# Patient Record
Sex: Female | Born: 1945 | Race: Black or African American | Hispanic: No | Smoking: Former smoker
Health system: Southern US, Community
[De-identification: ages and names within clinical notes are randomized; demographics above are authoritative.]

## PROBLEM LIST (undated history)

## (undated) DIAGNOSIS — Z8719 Personal history of other diseases of the digestive system: Secondary | ICD-10-CM

## (undated) DIAGNOSIS — I82409 Acute embolism and thrombosis of unspecified deep veins of unspecified lower extremity: Secondary | ICD-10-CM

## (undated) DIAGNOSIS — E109 Type 1 diabetes mellitus without complications: Secondary | ICD-10-CM

## (undated) DIAGNOSIS — K275 Chronic or unspecified peptic ulcer, site unspecified, with perforation: Secondary | ICD-10-CM

## (undated) DIAGNOSIS — C259 Malignant neoplasm of pancreas, unspecified: Secondary | ICD-10-CM

## (undated) DIAGNOSIS — E7800 Pure hypercholesterolemia, unspecified: Secondary | ICD-10-CM

## (undated) DIAGNOSIS — Z8711 Personal history of peptic ulcer disease: Secondary | ICD-10-CM

## (undated) DIAGNOSIS — D649 Anemia, unspecified: Secondary | ICD-10-CM

## (undated) DIAGNOSIS — R011 Cardiac murmur, unspecified: Secondary | ICD-10-CM

## (undated) DIAGNOSIS — I509 Heart failure, unspecified: Secondary | ICD-10-CM

## (undated) DIAGNOSIS — E039 Hypothyroidism, unspecified: Secondary | ICD-10-CM

## (undated) DIAGNOSIS — I219 Acute myocardial infarction, unspecified: Secondary | ICD-10-CM

## (undated) HISTORY — PX: CARDIAC CATHETERIZATION: SHX172

## (undated) HISTORY — PX: VASCULAR SURGERY: SHX849

---
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
## 1992-08-30 HISTORY — PX: VAGINAL HYSTERECTOMY: SUR661

## 1994-08-30 HISTORY — PX: CHOLECYSTECTOMY: SHX55

## 2000-01-29 HISTORY — PX: REPAIR OF PERFORATED ULCER: SHX6065

## 2000-08-30 DIAGNOSIS — C259 Malignant neoplasm of pancreas, unspecified: Secondary | ICD-10-CM

## 2000-08-30 HISTORY — PX: WHIPPLE PROCEDURE: SHX2667

## 2000-08-30 HISTORY — DX: Malignant neoplasm of pancreas, unspecified: C25.9

## 2001-01-28 DIAGNOSIS — E109 Type 1 diabetes mellitus without complications: Secondary | ICD-10-CM

## 2001-01-28 HISTORY — DX: Type 1 diabetes mellitus without complications: E10.9

## 2001-03-30 DIAGNOSIS — E039 Hypothyroidism, unspecified: Secondary | ICD-10-CM

## 2001-03-30 HISTORY — DX: Hypothyroidism, unspecified: E03.9

## 2010-08-30 DIAGNOSIS — D649 Anemia, unspecified: Secondary | ICD-10-CM

## 2010-08-30 HISTORY — DX: Anemia, unspecified: D64.9

## 2010-09-30 HISTORY — PX: PERIPHERAL ARTERIAL STENT GRAFT: SHX2220

## 2013-11-06 ENCOUNTER — Other Ambulatory Visit: Payer: Self-pay

## 2013-11-06 ENCOUNTER — Inpatient Hospital Stay (HOSPITAL_BASED_OUTPATIENT_CLINIC_OR_DEPARTMENT_OTHER)
Admission: EM | Admit: 2013-11-06 | Discharge: 2013-11-08 | DRG: 683 | Disposition: A | Discharge status: Home / Self Care | Payer: Medicare Other | Attending: Internal Medicine | Admitting: Internal Medicine

## 2013-11-06 ENCOUNTER — Inpatient Hospital Stay (HOSPITAL_COMMUNITY): Payer: Medicare Other

## 2013-11-06 ENCOUNTER — Encounter (HOSPITAL_BASED_OUTPATIENT_CLINIC_OR_DEPARTMENT_OTHER): Payer: Self-pay | Admitting: Emergency Medicine

## 2013-11-06 DIAGNOSIS — E872 Acidosis, unspecified: Secondary | ICD-10-CM

## 2013-11-06 DIAGNOSIS — Z7901 Long term (current) use of anticoagulants: Secondary | ICD-10-CM

## 2013-11-06 DIAGNOSIS — Z803 Family history of malignant neoplasm of breast: Secondary | ICD-10-CM

## 2013-11-06 DIAGNOSIS — I10 Essential (primary) hypertension: Secondary | ICD-10-CM | POA: Diagnosis present

## 2013-11-06 DIAGNOSIS — Z841 Family history of disorders of kidney and ureter: Secondary | ICD-10-CM

## 2013-11-06 DIAGNOSIS — I2510 Atherosclerotic heart disease of native coronary artery without angina pectoris: Secondary | ICD-10-CM | POA: Diagnosis present

## 2013-11-06 DIAGNOSIS — Z79899 Other long term (current) drug therapy: Secondary | ICD-10-CM

## 2013-11-06 DIAGNOSIS — Y9210 Unspecified residential institution as the place of occurrence of the external cause: Secondary | ICD-10-CM | POA: Diagnosis not present

## 2013-11-06 DIAGNOSIS — Z8509 Personal history of malignant neoplasm of other digestive organs: Secondary | ICD-10-CM

## 2013-11-06 DIAGNOSIS — Z800 Family history of malignant neoplasm of digestive organs: Secondary | ICD-10-CM

## 2013-11-06 DIAGNOSIS — N179 Acute kidney failure, unspecified: Principal | ICD-10-CM

## 2013-11-06 DIAGNOSIS — Z8042 Family history of malignant neoplasm of prostate: Secondary | ICD-10-CM

## 2013-11-06 DIAGNOSIS — E875 Hyperkalemia: Secondary | ICD-10-CM | POA: Diagnosis present

## 2013-11-06 DIAGNOSIS — S3720XA Unspecified injury of bladder, initial encounter: Secondary | ICD-10-CM | POA: Diagnosis not present

## 2013-11-06 DIAGNOSIS — R319 Hematuria, unspecified: Secondary | ICD-10-CM | POA: Diagnosis not present

## 2013-11-06 DIAGNOSIS — Z794 Long term (current) use of insulin: Secondary | ICD-10-CM

## 2013-11-06 DIAGNOSIS — Z9089 Acquired absence of other organs: Secondary | ICD-10-CM

## 2013-11-06 DIAGNOSIS — Z880 Allergy status to penicillin: Secondary | ICD-10-CM

## 2013-11-06 DIAGNOSIS — Z87891 Personal history of nicotine dependence: Secondary | ICD-10-CM

## 2013-11-06 DIAGNOSIS — E1165 Type 2 diabetes mellitus with hyperglycemia: Secondary | ICD-10-CM | POA: Diagnosis present

## 2013-11-06 DIAGNOSIS — E118 Type 2 diabetes mellitus with unspecified complications: Secondary | ICD-10-CM

## 2013-11-06 DIAGNOSIS — S3730XA Unspecified injury of urethra, initial encounter: Secondary | ICD-10-CM | POA: Diagnosis not present

## 2013-11-06 DIAGNOSIS — E039 Hypothyroidism, unspecified: Secondary | ICD-10-CM

## 2013-11-06 DIAGNOSIS — IMO0002 Reserved for concepts with insufficient information to code with codable children: Secondary | ICD-10-CM

## 2013-11-06 DIAGNOSIS — E1129 Type 2 diabetes mellitus with other diabetic kidney complication: Secondary | ICD-10-CM

## 2013-11-06 DIAGNOSIS — X58XXXA Exposure to other specified factors, initial encounter: Secondary | ICD-10-CM | POA: Diagnosis not present

## 2013-11-06 DIAGNOSIS — Z91040 Latex allergy status: Secondary | ICD-10-CM

## 2013-11-06 DIAGNOSIS — Z8249 Family history of ischemic heart disease and other diseases of the circulatory system: Secondary | ICD-10-CM

## 2013-11-06 DIAGNOSIS — Z86718 Personal history of other venous thrombosis and embolism: Secondary | ICD-10-CM

## 2013-11-06 DIAGNOSIS — Z7982 Long term (current) use of aspirin: Secondary | ICD-10-CM

## 2013-11-06 DIAGNOSIS — Z801 Family history of malignant neoplasm of trachea, bronchus and lung: Secondary | ICD-10-CM

## 2013-11-06 DIAGNOSIS — I252 Old myocardial infarction: Secondary | ICD-10-CM

## 2013-11-06 HISTORY — DX: Hypothyroidism, unspecified: E03.9

## 2013-11-06 HISTORY — DX: Chronic or unspecified peptic ulcer, site unspecified, with perforation: K27.5

## 2013-11-06 HISTORY — DX: Acute myocardial infarction, unspecified: I21.9

## 2013-11-06 HISTORY — DX: Heart failure, unspecified: I50.9

## 2013-11-06 HISTORY — DX: Anemia, unspecified: D64.9

## 2013-11-06 HISTORY — DX: Malignant neoplasm of pancreas, unspecified: C25.9

## 2013-11-06 HISTORY — DX: Type 1 diabetes mellitus without complications: E10.9

## 2013-11-06 HISTORY — DX: Acute embolism and thrombosis of unspecified deep veins of unspecified lower extremity: I82.409

## 2013-11-06 HISTORY — DX: Personal history of other diseases of the digestive system: Z87.19

## 2013-11-06 HISTORY — DX: Personal history of peptic ulcer disease: Z87.11

## 2013-11-06 HISTORY — DX: Pure hypercholesterolemia, unspecified: E78.00

## 2013-11-06 HISTORY — DX: Cardiac murmur, unspecified: R01.1

## 2013-11-06 LAB — CBC WITH DIFFERENTIAL/PLATELET
Basophils Absolute: 0 10*3/uL (ref 0.0–0.1)
Basophils Relative: 0 % (ref 0–1)
EOS PCT: 1 % (ref 0–5)
Eosinophils Absolute: 0.1 10*3/uL (ref 0.0–0.7)
HEMATOCRIT: 36 % (ref 36.0–46.0)
Hemoglobin: 12.1 g/dL (ref 12.0–15.0)
Lymphocytes Relative: 15 % (ref 12–46)
Lymphs Abs: 1.5 10*3/uL (ref 0.7–4.0)
MCH: 31.1 pg (ref 26.0–34.0)
MCHC: 33.6 g/dL (ref 30.0–36.0)
MCV: 92.5 fL (ref 78.0–100.0)
MONO ABS: 0.7 10*3/uL (ref 0.1–1.0)
Monocytes Relative: 7 % (ref 3–12)
Neutro Abs: 7.7 10*3/uL (ref 1.7–7.7)
Neutrophils Relative %: 77 % (ref 43–77)
PLATELETS: 295 10*3/uL (ref 150–400)
RBC: 3.89 MIL/uL (ref 3.87–5.11)
RDW: 14.2 % (ref 11.5–15.5)
WBC: 10 10*3/uL (ref 4.0–10.5)

## 2013-11-06 LAB — HEMOGLOBIN A1C
HEMOGLOBIN A1C: 5.8 % — AB (ref <5.7)
MEAN PLASMA GLUCOSE: 120 mg/dL — AB (ref <117)

## 2013-11-06 LAB — URINALYSIS, ROUTINE W REFLEX MICROSCOPIC
Bilirubin Urine: NEGATIVE
GLUCOSE, UA: NEGATIVE mg/dL
Hgb urine dipstick: NEGATIVE
KETONES UR: NEGATIVE mg/dL
LEUKOCYTES UA: NEGATIVE
NITRITE: NEGATIVE
PROTEIN: NEGATIVE mg/dL
Specific Gravity, Urine: 1.019 (ref 1.005–1.030)
Urobilinogen, UA: 0.2 mg/dL (ref 0.0–1.0)
pH: 5.5 (ref 5.0–8.0)

## 2013-11-06 LAB — COMPREHENSIVE METABOLIC PANEL
ALT: 16 U/L (ref 0–35)
AST: 23 U/L (ref 0–37)
Albumin: 4.2 g/dL (ref 3.5–5.2)
Alkaline Phosphatase: 141 U/L — ABNORMAL HIGH (ref 39–117)
BUN: 53 mg/dL — ABNORMAL HIGH (ref 6–23)
CALCIUM: 10 mg/dL (ref 8.4–10.5)
CO2: 14 mEq/L — ABNORMAL LOW (ref 19–32)
CREATININE: 3.3 mg/dL — AB (ref 0.50–1.10)
Chloride: 103 mEq/L (ref 96–112)
GFR, EST AFRICAN AMERICAN: 16 mL/min — AB (ref >90)
GFR, EST NON AFRICAN AMERICAN: 14 mL/min — AB (ref >90)
Glucose, Bld: 62 mg/dL — ABNORMAL LOW (ref 70–99)
Potassium: 7 mEq/L (ref 3.7–5.3)
Sodium: 135 mEq/L — ABNORMAL LOW (ref 137–147)
TOTAL PROTEIN: 8.2 g/dL (ref 6.0–8.3)
Total Bilirubin: 0.3 mg/dL (ref 0.3–1.2)

## 2013-11-06 LAB — TROPONIN I

## 2013-11-06 LAB — BASIC METABOLIC PANEL
BUN: 50 mg/dL — ABNORMAL HIGH (ref 6–23)
CALCIUM: 9.5 mg/dL (ref 8.4–10.5)
CO2: 15 mEq/L — ABNORMAL LOW (ref 19–32)
Chloride: 106 mEq/L (ref 96–112)
Creatinine, Ser: 3.03 mg/dL — ABNORMAL HIGH (ref 0.50–1.10)
GFR calc non Af Amer: 15 mL/min — ABNORMAL LOW (ref >90)
GFR, EST AFRICAN AMERICAN: 18 mL/min — AB (ref >90)
Glucose, Bld: 54 mg/dL — ABNORMAL LOW (ref 70–99)
Potassium: 5.2 mEq/L (ref 3.7–5.3)
SODIUM: 140 meq/L (ref 137–147)

## 2013-11-06 LAB — CBG MONITORING, ED: Glucose-Capillary: 87 mg/dL (ref 70–99)

## 2013-11-06 LAB — LIPASE, BLOOD: Lipase: 57 U/L (ref 11–59)

## 2013-11-06 MED ORDER — INSULIN ASPART 100 UNIT/ML ~~LOC~~ SOLN
SUBCUTANEOUS | Status: AC
  Administered 2013-11-06: 10 [IU] via INTRAVENOUS
  Filled 2013-11-06: qty 1

## 2013-11-06 MED ORDER — ONDANSETRON HCL 4 MG PO TABS: 4.0000 mg | ORAL_TABLET | Freq: Four times a day (QID) | ORAL | Status: DC | PRN

## 2013-11-06 MED ORDER — SODIUM CHLORIDE 0.45 % IV SOLN
INTRAVENOUS | Status: DC
  Administered 2013-11-06: 18:00:00 via INTRAVENOUS
  Administered 2013-11-06: 1000 mL via INTRAVENOUS

## 2013-11-06 MED ORDER — SODIUM BICARBONATE 8.4 % IV SOLN
50.0000 meq | Freq: Once | INTRAVENOUS | Status: AC
  Administered 2013-11-06: 50 meq via INTRAVENOUS
  Filled 2013-11-06: qty 50

## 2013-11-06 MED ORDER — INSULIN ASPART 100 UNIT/ML ~~LOC~~ SOLN
SUBCUTANEOUS | Status: AC
  Filled 2013-11-06: qty 1

## 2013-11-06 MED ORDER — HEPARIN SODIUM (PORCINE) 5000 UNIT/ML IJ SOLN
5000.0000 [IU] | Freq: Three times a day (TID) | INTRAMUSCULAR | Status: DC
  Filled 2013-11-06 (×6): qty 1

## 2013-11-06 MED ORDER — FUROSEMIDE 10 MG/ML IJ SOLN
80.0000 mg | Freq: Once | INTRAMUSCULAR | Status: AC
  Administered 2013-11-06: 80 mg via INTRAVENOUS
  Filled 2013-11-06: qty 8

## 2013-11-06 MED ORDER — INSULIN ASPART 100 UNIT/ML IV SOLN
10.0000 [IU] | Freq: Once | INTRAVENOUS | Status: AC
  Administered 2013-11-06: 10 [IU] via INTRAVENOUS
  Filled 2013-11-06: qty 0.1

## 2013-11-06 MED ORDER — SODIUM CHLORIDE 0.9 % IJ SOLN
3.0000 mL | Freq: Two times a day (BID) | INTRAMUSCULAR | Status: DC
  Administered 2013-11-08: 3 mL via INTRAVENOUS

## 2013-11-06 MED ORDER — INSULIN ASPART 100 UNIT/ML ~~LOC~~ SOLN
0.0000 [IU] | SUBCUTANEOUS | Status: DC
  Administered 2013-11-07 – 2013-11-08 (×4): 2 [IU] via SUBCUTANEOUS
  Administered 2013-11-08: 3 [IU] via SUBCUTANEOUS
  Administered 2013-11-08: 1 [IU] via SUBCUTANEOUS

## 2013-11-06 MED ORDER — SODIUM POLYSTYRENE SULFONATE 15 GM/60ML PO SUSP
30.0000 g | Freq: Once | ORAL | Status: AC
  Administered 2013-11-06: 30 g via ORAL

## 2013-11-06 MED ORDER — ONDANSETRON HCL 4 MG/2ML IJ SOLN: 4.0000 mg | Freq: Four times a day (QID) | INTRAMUSCULAR | Status: DC | PRN

## 2013-11-06 MED ORDER — SODIUM CHLORIDE 0.9 % IV SOLN
Freq: Once | INTRAVENOUS | Status: AC
  Administered 2013-11-06: 12:00:00 via INTRAVENOUS

## 2013-11-06 MED ORDER — ACETAMINOPHEN 650 MG RE SUPP: 650.0000 mg | Freq: Four times a day (QID) | RECTAL | Status: DC | PRN

## 2013-11-06 MED ORDER — DEXTROSE 50 % IV SOLN
50.0000 mL | Freq: Once | INTRAVENOUS | Status: AC
Start: 2013-11-06 — End: 2013-11-06
  Administered 2013-11-06: 50 mL via INTRAVENOUS
  Filled 2013-11-06: qty 50

## 2013-11-06 MED ORDER — ACETAMINOPHEN 325 MG PO TABS: 650.0000 mg | ORAL_TABLET | Freq: Four times a day (QID) | ORAL | Status: DC | PRN

## 2013-11-06 MED ORDER — SODIUM POLYSTYRENE SULFONATE 15 GM/60ML PO SUSP: 30.0000 g | Freq: Once | ORAL | Status: DC

## 2013-11-06 MED ORDER — SODIUM POLYSTYRENE SULFONATE 15 GM/60ML PO SUSP
30.0000 g | Freq: Once | ORAL | Status: AC
  Administered 2013-11-06: 30 g via ORAL
  Filled 2013-11-06: qty 120

## 2013-11-06 MED ORDER — SODIUM POLYSTYRENE SULFONATE 15 GM/60ML PO SUSP
ORAL | Status: AC
  Administered 2013-11-06: 30 g via ORAL
  Filled 2013-11-06: qty 120

## 2013-11-06 NOTE — ED Notes (Signed)
Fluids offered to Pt. Sprite given. Tolerating well.

## 2013-11-06 NOTE — ED Notes (Addendum)
Attempt to call report to Zacarias Pontes, RN unavailable for report b/c on lunch and charge RN currently taking report on another pt.  Per Aeronautical engineer will call back when available.

## 2013-11-06 NOTE — H&P (Signed)
[facility] History and Physical  Sarah Liu ZOX:[id number] DOB: [birth year] DOA: 11/06/2013  Referring physician: er PCP: Sarah Liu., MD   Chief Complaint: decreased appetite  HPI: Sarah Liu is a 68 y.o. female  Who comes in with the cc of decreased appetite (duration 2 week).  She was seen recently in an urgent care with a Cr of 2 (baseline 0.8) but refused further work up.    She then presented to [facility].  There she was found to have a Cr of 3 and a K of 7.  She was transferred here to have nephrology consultation.   Patient has DM and is somewhat uncontrolled.  No SOB, no CP, no fever, no chills.  Has been making good urine. Denies NSAID use Treated at med center with glucose, insulin, bicarb, kayexalate and lasix and IVF  Review of Systems:  All systems reviewed, negative unless stated above   Past Medical History  Diagnosis Date  . Diabetes mellitus without complication   . Pancreatic cancer   . Heart attack   . Thyroid disease   . Perforated ulcer   . DVT (deep venous thrombosis)    Past Surgical History  Procedure Laterality Date  . Abdominal hysterectomy    . Cholecystectomy    . Whipple procedure    . Vascular surgery     Social History:  reports that she has quit smoking. She does not have any smokeless tobacco history on file. She reports that she does not drink alcohol. Her drug history is not on file.  Allergies  Allergen Reactions  . Latex Hives  . Penicillins Hives    No family history on file.   Prior to Admission medications   Medication Sig Start Date End Date Taking? Authorizing Provider  aspirin 81 MG tablet Take 81 mg by mouth daily.   Yes Historical Provider, MD  ATORVASTATIN CALCIUM PO Take by mouth.   Yes Historical Provider, MD  CARVEDILOL PO Take by mouth.   Yes Historical Provider, MD  cholecalciferol (VITAMIN D) 1000 UNITS tablet Take 1,000 Units by mouth daily.   Yes Historical Provider, MD  Furosemide (LASIX PO)  Take by mouth.   Yes Historical Provider, MD  insulin glargine (LANTUS) 100 UNIT/ML injection Inject into the skin at bedtime.   Yes Historical Provider, MD  LOSARTAN POTASSIUM PO Take by mouth.   Yes Historical Provider, MD  MECLIZINE HCL PO Take by mouth.   Yes Historical Provider, MD  METFORMIN HCL PO Take by mouth.   Yes Historical Provider, MD  OMEPRAZOLE PO Take by mouth.   Yes Historical Provider, MD  POTASSIUM CHLORIDE ER PO Take by mouth.   Yes Historical Provider, MD  sulfamethoxazole-trimethoprim (BACTRIM DS) 800-160 MG per tablet Take 1 tablet by mouth 2 (two) times daily.   Yes Historical Provider, MD  WARFARIN SODIUM PO Take by mouth.   Yes Historical Provider, MD   Physical Exam: Filed Vitals:   11/06/13 1426  BP: 139/70  Pulse: 85  Temp: 97.8 F (36.6 C)  Resp: 16    BP 139/70  Pulse 85  Temp(Src) 97.8 F (36.6 C) (Oral)  Resp 16  Ht 5' 4.5" (1.638 m)  Wt 73.483 kg (162 lb)  BMI 27.39 kg/m2  SpO2 100%  General:  Appears calm and comfortable Eyes: PERRL, normal lids, irises & conjunctiva ENT: grossly normal hearing, lips & tongue Neck: no LAD, masses or thyromegaly Cardiovascular: RRR, no m/r/g. No LE edema. Telemetry: SR, no arrhythmias  Respiratory:  CTA bilaterally, no w/r/r. Normal respiratory effort. Abdomen: soft, ntnd Skin: no rash or induration seen on limited exam Musculoskeletal: grossly normal tone BUE/BLE Psychiatric: grossly normal mood and affect, speech fluent and appropriate Neurologic: grossly non-focal.          Labs on Admission:  Basic Metabolic Panel:  Recent Labs Lab 11/06/13 1158  NA 135*  K 7.0*  CL 103  CO2 14*  GLUCOSE 62*  BUN 53*  CREATININE 3.30*  CALCIUM 10.0   Liver Function Tests:  Recent Labs Lab 11/06/13 1158  AST 23  ALT 16  ALKPHOS 141*  BILITOT 0.3  PROT 8.2  ALBUMIN 4.2    Recent Labs Lab 11/06/13 1158  LIPASE 57   No results found for this basename: AMMONIA,  in the last 168  hours CBC:  Recent Labs Lab 11/06/13 1158  WBC 10.0  NEUTROABS 7.7  HGB 12.1  HCT 36.0  MCV 92.5  PLT 295   Cardiac Enzymes:  Recent Labs Lab 11/06/13 1158  TROPONINI <0.30    BNP (last 3 results) No results found for this basename: PROBNP,  in the last 8760 hours CBG:  Recent Labs Lab 11/06/13 Early on Admission: No results found.  EKG: Independently reviewed. No peaked T waves  Assessment/Plan Active Problems:   Hyperkalemia   AKI (acute kidney injury)   Type II or unspecified type diabetes mellitus with unspecified complication, uncontrolled   Unspecified hypothyroidism   AKI- Cr at baseline is 0.8; Cr last month- was 2 but patient refused any other care, renal ultrasound, IVF; lasix; appreciate renal -repeat K at 6 PM and give lasix if still high -repeat K exelate -SpEP, light chains, ANA  Hyperkalemia- holding ACE, holding K supplement, on tele  DM- SSI  Hypothyroid- check TSH  Renal  Code Status: full Family Communication: patient Disposition Plan: inpt  Time spent: 75 min  [facility] [place] [facility] Pager [phone]

## 2013-11-06 NOTE — Consult Note (Signed)
Sarah Liu is an 68 y.o. female referred by Dr Sarah Liu   Chief Complaint: 67yo BF presented to [facility] [place] decreased appetite x 2wks.  No N/V.  No other Sxs.  Labs showed Scr 3.3 and K 7.0.  Treated at med center with glucose,insulin,bicarb, kayexalate and lasix. In 10/03/13 she had labs done that showed Scr 2.07 and was rec to see nephrologist which she refused to do. Scr 2/14 was 1.05 and 2012 was .8.  No Gross hematuria, no stones, no obs sxs and no NSAID's.  Has been on losartan and K supp.  Hx HTN x 42yr and DM x 113yr(after whipple for pancreatic Ca) HPI: As above  Past Medical History  Diagnosis Date  . Diabetes mellitus without complication   . Pancreatic cancer   . Heart attack   . Thyroid disease   . Perforated ulcer   . DVT (deep venous thrombosis)     Past Surgical History  Procedure Laterality Date  . Abdominal hysterectomy    . Cholecystectomy    . Whipple procedure    . Vascular surgery      No family history on file. Father prostate CA.  Mother ESRD sec HTN.  Sister CKD ? Etiology, pancreatic Ca and breast Ca.  2nd sister Lung CA Social History:  reports that she has quit smoking. She does not have any smokeless tobacco history on file. She reports that she does not drink alcohol. Her drug history is not on file. Ex-smoker 1 ppd x 3086yruit 2011. Nondrinker. Divorced. 1 child. Lives by self Allergies:  Allergies  Allergen Reactions  . Latex   . Penicillins     Medications Prior to Admission  Medication Sig Dispense Refill  . aspirin 81 MG tablet Take 81 mg by mouth daily.      . ATORVASTATIN CALCIUM PO Take by mouth.      . CMarland KitchenRVEDILOL PO Take by mouth.      . cholecalciferol (VITAMIN D) 1000 UNITS tablet Take 1,000 Units by mouth daily.      . Furosemide (LASIX PO) Take by mouth.      . insulin glargine (LANTUS) 100 UNIT/ML injection Inject into the skin at bedtime.      . LMarland KitchenSARTAN POTASSIUM PO Take by mouth.      . MECLIZINE HCL PO Take by  mouth.      . METFORMIN HCL PO Take by mouth.      . OMEPRAZOLE PO Take by mouth.      . PMarland KitchenTASSIUM CHLORIDE ER PO Take by mouth.      . sulfamethoxazole-trimethoprim (BACTRIM DS) 800-160 MG per tablet Take 1 tablet by mouth 2 (two) times daily.      . WARFARIN SODIUM PO Take by mouth.         Lab Results: UA: benign   Recent Labs  11/06/13 1158  WBC 10.0  HGB 12.1  HCT 36.0  PLT 295   BMET  Recent Labs  11/06/13 1158  NA 135*  K 7.0*  CL 103  CO2 14*  GLUCOSE 62*  BUN 53*  CREATININE 3.30*  CALCIUM 10.0   LFT  Recent Labs  11/06/13 1158  PROT 8.2  ALBUMIN 4.2  AST 23  ALT 16  ALKPHOS 141*  BILITOT 0.3   No results found.  ROS: No change in vision No SOB NO cp No change in bowels No arthritic CO No neuropathic Sxs No edema  PHYSICAL EXAM: Blood pressure 139/70,  pulse 85, temperature 97.8 F (36.6 C), temperature source Oral, resp. rate 16, height 5' 4.5" (1.638 m), weight 73.483 kg (162 lb), SpO2 100.00%. HEENT: PERRLA EOMI NECK:No bruits, No JVD LUNGS:clear CARDIAC:RRR wo MRG ABD:+ BS NTND No hsm EXT:no edema NEURO:CNI  M&SI Ox3 no asterixis  Assessment: 1. Acute/subacute renal failure ? Etiology.  With normal UA makes one think simply volume depletion on ARB but will need further WU as not convinced this explains it all 2. Hyperkalemia sec ARF, losartan and K supps 3. Met acidosis sec ARF +/- metformin 4. HTN 5. DM  PLAN: 1. IV fluids (and lasix if repeat K still high) 2. Give another dose of kayexalate 3. Renal US 4. DC losartan and metformin and K supps 5. SPEP, light chains, ANA.   6. FU K level tonite   Sarah Liu T 11/06/2013, 4:20 PM

## 2013-11-06 NOTE — ED Notes (Signed)
Pt ambulatory to restroom no difficulty.  

## 2013-11-06 NOTE — ED Notes (Signed)
Pt placed on cardiac monitor 

## 2013-11-06 NOTE — ED Provider Notes (Signed)
CSN: [id number]     Arrival date & time 11/06/13  1106 History   First MD Initiated Contact with Patient 11/06/13 1130     Chief Complaint  Patient presents with  . Decreased appetite      (Consider location/radiation/quality/duration/timing/severity/associated sxs/prior Treatment) HPI Comments: Patient is a 68 year old female with extensive past medical history including diabetes, coronary artery disease, Whipple procedure for pancreatic cancer in 2002. He presents today with a 2 week history of decreased appetite. She states that she has been unable to eat or drink as she has no desire to do so. She denies to me she is having any discomfort, diarrhea, vomiting, or other symptoms. She tells me her sugars have been somewhat erratic but this is not out of the ordinary. There are no new medications. She denies fevers or chills. She denies chest pain or shortness of breath.  The history is provided by the patient.    Past Medical History  Diagnosis Date  . Diabetes mellitus without complication   . Pancreatic cancer   . Heart attack   . Thyroid disease   . Perforated ulcer   . DVT (deep venous thrombosis)    Past Surgical History  Procedure Laterality Date  . Abdominal hysterectomy    . Cholecystectomy    . Whipple procedure    . Vascular surgery     No family history on file. History  Substance Use Topics  . Smoking status: Not on file  . Smokeless tobacco: Not on file  . Alcohol Use: Not on file   OB History   Grav Para Term Preterm Abortions TAB SAB Ect Mult Living                 Review of Systems  Constitutional: Positive for appetite change.  All other systems reviewed and are negative.      Allergies  Latex and Penicillins  Home Medications  No current outpatient prescriptions on file. BP 123/67  Pulse 75  Resp 18  Ht 5' 4.5" (1.638 m)  Wt 162 lb (73.483 kg)  BMI 27.39 kg/m2  SpO2 100% Physical Exam  Nursing note and vitals  reviewed. Constitutional: She is oriented to person, place, and time. She appears well-developed and well-nourished. No distress.  HENT:  Head: Normocephalic and atraumatic.  Neck: Normal range of motion. Neck supple.  Cardiovascular: Normal rate and regular rhythm.  Exam reveals no gallop and no friction rub.   No murmur heard. Pulmonary/Chest: Effort normal and breath sounds normal. No respiratory distress. She has no wheezes.  Abdominal: Soft. Bowel sounds are normal. She exhibits no distension. There is no tenderness.  Musculoskeletal: Normal range of motion.  Neurological: She is alert and oriented to person, place, and time.  Skin: Skin is warm and dry. She is not diaphoretic.    ED Course  Procedures (including critical care time) Labs Review Labs Reviewed - No data to display Imaging Review No results found.   EKG Interpretation   Date/Time:  Tuesday November 06 2013 10:39:02 EDT Ventricular Rate:  76 PR Interval:  180 QRS Duration: 76 QT Interval:  366 QTC Calculation: 411 R Axis:   38 Text Interpretation:  Normal sinus rhythm Low voltage QRS Cannot rule out  Anteroseptal infarct , age undetermined Abnormal ECG Confirmed by Beau Fanny   MD, Ayriana Wix ([id number]) on 11/06/2013 2:40:22 PM      MDM   Final diagnoses:  None    Patient is a 68 year old female with history of diabetes, pancreatic  cancer, coronary artery disease, peripheral vascular disease. He presents today with complaints of weakness and decreased appetite for several weeks. Workup reveals acute renal failure with decreased bicarbonate and elevated potassium. She was given dextrose and insulin, Kayexalate, bicarbonate, and Lasix. I've discussed the case with Dr. Mercy Moore from nephrology who recommends admission to the medicine service. Dr. Verlon Au agrees to admit.     Veryl Speak, MD 11/06/13 [phone]

## 2013-11-06 NOTE — ED Notes (Signed)
C/o decreased appetite x 2-3 weeks-denies pain-steady gait to tx area-drinking water

## 2013-11-07 DIAGNOSIS — Z7901 Long term (current) use of anticoagulants: Secondary | ICD-10-CM

## 2013-11-07 DIAGNOSIS — E872 Acidosis, unspecified: Secondary | ICD-10-CM

## 2013-11-07 LAB — KAPPA/LAMBDA LIGHT CHAINS
KAPPA, LAMDA LIGHT CHAIN RATIO: 1.61 (ref 0.26–1.65)
Kappa free light chain: 4.61 mg/dL — ABNORMAL HIGH (ref 0.33–1.94)
LAMDA FREE LIGHT CHAINS: 2.86 mg/dL — AB (ref 0.57–2.63)

## 2013-11-07 LAB — RENAL FUNCTION PANEL
ALBUMIN: 3.8 g/dL (ref 3.5–5.2)
BUN: 48 mg/dL — ABNORMAL HIGH (ref 6–23)
CALCIUM: 9.5 mg/dL (ref 8.4–10.5)
CO2: 14 mEq/L — ABNORMAL LOW (ref 19–32)
CREATININE: 2.85 mg/dL — AB (ref 0.50–1.10)
Chloride: 106 mEq/L (ref 96–112)
GFR calc Af Amer: 19 mL/min — ABNORMAL LOW (ref >90)
GFR, EST NON AFRICAN AMERICAN: 16 mL/min — AB (ref >90)
Glucose, Bld: 122 mg/dL — ABNORMAL HIGH (ref 70–99)
Phosphorus: 3.8 mg/dL (ref 2.3–4.6)
Potassium: 4.6 mEq/L (ref 3.7–5.3)
Sodium: 140 mEq/L (ref 137–147)

## 2013-11-07 LAB — TSH: TSH: 1.673 u[IU]/mL (ref 0.350–4.500)

## 2013-11-07 LAB — GLUCOSE, CAPILLARY
GLUCOSE-CAPILLARY: 112 mg/dL — AB (ref 70–99)
GLUCOSE-CAPILLARY: 132 mg/dL — AB (ref 70–99)
GLUCOSE-CAPILLARY: 255 mg/dL — AB (ref 70–99)
Glucose-Capillary: 172 mg/dL — ABNORMAL HIGH (ref 70–99)
Glucose-Capillary: 156 mg/dL — ABNORMAL HIGH (ref 70–99)
Glucose-Capillary: 188 mg/dL — ABNORMAL HIGH (ref 70–99)

## 2013-11-07 LAB — CBC
HCT: 32.6 % — ABNORMAL LOW (ref 36.0–46.0)
Hemoglobin: 11.4 g/dL — ABNORMAL LOW (ref 12.0–15.0)
MCH: 31.1 pg (ref 26.0–34.0)
MCHC: 35 g/dL (ref 30.0–36.0)
MCV: 89.1 fL (ref 78.0–100.0)
Platelets: 280 10*3/uL (ref 150–400)
RBC: 3.66 MIL/uL — ABNORMAL LOW (ref 3.87–5.11)
RDW: 14.1 % (ref 11.5–15.5)
WBC: 7.7 10*3/uL (ref 4.0–10.5)

## 2013-11-07 LAB — ANA: Anti Nuclear Antibody(ANA): NEGATIVE

## 2013-11-07 LAB — PROTIME-INR
INR: 3.8 — ABNORMAL HIGH (ref 0.00–1.49)
Prothrombin Time: 36 seconds — ABNORMAL HIGH (ref 11.6–15.2)

## 2013-11-07 MED ORDER — SODIUM CHLORIDE 0.9 % IV SOLN
INTRAVENOUS | Status: DC
  Administered 2013-11-07 – 2013-11-08 (×2): via INTRAVENOUS

## 2013-11-07 MED ORDER — SODIUM BICARBONATE 650 MG PO TABS
1300.0000 mg | ORAL_TABLET | Freq: Two times a day (BID) | ORAL | Status: DC
  Administered 2013-11-07 – 2013-11-08 (×3): 1300 mg via ORAL
  Filled 2013-11-07 (×4): qty 2

## 2013-11-07 MED ORDER — WARFARIN - PHARMACIST DOSING INPATIENT: Freq: Every day | Status: DC

## 2013-11-07 NOTE — Progress Notes (Signed)
ANTICOAGULATION CONSULT NOTE - Initial Consult  Pharmacy Consult for Coumadin Indication: h/o DVT  Allergies  Allergen Reactions  . Latex Hives  . Penicillins Hives    Patient Measurements: Height: 5' 4.5" (163.8 cm) Weight: 155 lb 8 oz (70.534 kg) IBW/kg (Calculated) : 55.85 Heparin Dosing Weight: n/a  Vital Signs: Temp: 98.1 F (36.7 C) (03/11 0305) Temp src: Oral (03/11 0305) BP: 126/61 mmHg (03/11 0305) Pulse Rate: 103 (03/11 0305)  Labs:  Recent Labs  11/06/13 1158 11/06/13 1715 11/07/13 0145 11/07/13 0146  HGB 12.1  --   --  11.4*  HCT 36.0  --   --  32.6*  PLT 295  --   --  280  LABPROT  --   --  36.0*  --   INR  --   --  3.80*  --   CREATININE 3.30* 3.03*  --  2.85*  TROPONINI <0.30  --   --   --     Estimated Creatinine Clearance: 19.2 ml/min (by C-G formula based on Cr of 2.85).   Medical History: Past Medical History  Diagnosis Date  . Perforated ulcer   . DVT (deep venous thrombosis) 2011; 2012    left arm; left leg  . Heart attack     91, 92  . Type I diabetes mellitus 01/2001  . Hypothyroidism 03/2001  . History of stomach ulcers     & jejunum9/2003  . Pancreatic cancer 08/2000  . High cholesterol   . Heart murmur   . CHF (congestive heart failure)   . Anemia 2012    Medications:  Prescriptions prior to admission  Medication Sig Dispense Refill  . aspirin EC 81 MG tablet Take 81 mg by mouth daily.      Marland Kitchen atorvastatin (LIPITOR) 40 MG tablet Take 40 mg by mouth daily.      . carvedilol (COREG) 6.25 MG tablet Take 18.75 mg by mouth 2 (two) times daily with a meal.      . Ergocalciferol (VITAMIN D2) 2000 UNITS TABS Take 2,000 Units by mouth daily.      . furosemide (LASIX) 20 MG tablet Take 20 mg by mouth daily.      . insulin glargine (LANTUS) 100 UNIT/ML injection Inject 30 Units into the skin at bedtime.      Marland Kitchen losartan (COZAAR) 100 MG tablet Take 100 mg by mouth daily.      . meclizine (ANTIVERT) 25 MG tablet Take 25 mg by mouth  daily as needed for dizziness.      . metFORMIN (GLUCOPHAGE) 850 MG tablet Take 850 mg by mouth 3 (three) times daily.      . Multiple Vitamins-Minerals (HAIR/SKIN/NAILS PO) Take 1 tablet by mouth daily.      Marland Kitchen omeprazole (PRILOSEC) 20 MG capsule Take 20 mg by mouth daily.      . Potassium (POTASSIMIN PO) Take 1 tablet by mouth daily. Potassium 526      . sulfamethoxazole-trimethoprim (BACTRIM DS) 800-160 MG per tablet Take 1 tablet by mouth 2 (two) times daily.      Marland Kitchen warfarin (COUMADIN) 5 MG tablet Take 2.5-5 mg by mouth daily. Take 1 tablet (5mg ) on Mondays and 1/2 tablet (2.5 mg) on all other days.        Assessment: 7 YOF with decreased appetite x 2 weeks. To resume coumadin therapy for h/o DVT. INR today was supra-therapeutic at 3.8 today. Hgb/Hct 11.4/32.6. Plt wnl.   Goal of Therapy:  INR 2-3 Monitor platelets by  anticoagulation protocol: Yes   Plan:  1) Hold coumadin today  2) Monitor daily INR and s/s of bleeding.   Albertina Parr, PharmD.  Clinical Pharmacist Pager [phone]

## 2013-11-07 NOTE — Progress Notes (Signed)
S: Feels better.  Ate breakfast  O:BP 126/61  Pulse 103  Temp(Src) 98.1 F (36.7 C) (Oral)  Resp 18  Ht 5' 4.5" (1.638 m)  Wt 70.534 kg (155 lb 8 oz)  BMI 26.29 kg/m2  SpO2 100%  Intake/Output Summary (Last 24 hours) at 11/07/13 1043 Last data filed at 11/07/13 0500  Gross per 24 hour  Intake 1316.67 ml  Output   1000 ml  Net 316.67 ml   Weight change:  VXY:IAXKP and alert CVS:RRR Resp:clear Abd:+ BS NTND Ext:no edema NEURO:CNI M&SI Ox3 no asterixis GU  Blood in foley   . heparin  5,000 Units Subcutaneous 3 times per day  . insulin aspart  0-9 Units Subcutaneous 6 times per day  . sodium chloride  3 mL Intravenous Q12H   US Renal  11/07/2013   CLINICAL DATA Acute renal failure.  EXAM RENAL/URINARY TRACT ULTRASOUND COMPLETE  COMPARISON None.  FINDINGS Right Kidney:  Length: 9.3 cm. 6 mm cyst is seen in midpole. Increased echogenicity is noted suggesting medical renal disease. No hydronephrosis is noted.  Left Kidney:  Length: 10.7 cm. Renal parenchyma is slightly echogenic suggesting medical renal disease. No hydronephrosis is noted.  Bladder:  Foley catheter in place.  Bladder is not visualized.  IMPRESSION No hydronephrosis or renal obstruction is noted. Increased echogenicity of renal parenchyma is noted suggesting medical renal disease.  SIGNATURE  Electronically Signed   By: Sabino Dick M.D.   On: 11/07/2013 03:05   BMET    Component Value Date/Time   NA 140 11/07/2013 0146   K 4.6 11/07/2013 0146   CL 106 11/07/2013 0146   CO2 14* 11/07/2013 0146   GLUCOSE 122* 11/07/2013 0146   BUN 48* 11/07/2013 0146   CREATININE 2.85* 11/07/2013 0146   CALCIUM 9.5 11/07/2013 0146   GFRNONAA 16* 11/07/2013 0146   GFRAA 19* 11/07/2013 0146   CBC    Component Value Date/Time   WBC 7.7 11/07/2013 0146   RBC 3.66* 11/07/2013 0146   HGB 11.4* 11/07/2013 0146   HCT 32.6* 11/07/2013 0146   PLT 280 11/07/2013 0146   MCV 89.1 11/07/2013 0146   MCH 31.1 11/07/2013 0146   MCHC 35.0 11/07/2013  0146   RDW 14.1 11/07/2013 0146   LYMPHSABS 1.5 11/06/2013 1158   MONOABS 0.7 11/06/2013 1158   EOSABS 0.1 11/06/2013 1158   BASOSABS 0.0 11/06/2013 1158     Assessment: 1. Acute/subacute renal failure 2. Hyperkalemia, resolved 3. Met acidosis 4 HTN 5. Hematuria, probably sec foley trauma as UA prior to placement was NL  Plan: 1. Cont IV fluids 2. Await SPEP 3. Re check Scr in AM 4. Start oral bicarb   Johnpaul Gillentine T

## 2013-11-07 NOTE — Progress Notes (Signed)
Utilization Review Completed.Soraya Paquette T3/06/2014  

## 2013-11-07 NOTE — Progress Notes (Addendum)
[facility] PROGRESS NOTE  Sarah Liu AOZ:[id number] DOB: 09/14/1945 DOA: 11/06/2013 PCP: Lilian Coma., MD  Assessment/Plan: 1. Acute kidney injury. Could be secondary to prerenal azotemia/ATN, as she had been on diuretic therapy. She had denied  NSAID use.  Pending SPEP, light chains, ANA. Creatinine trended down to 2.8 from 3.3 on 11/06/2013 as she received IV fluids. Plan to continue IV fluid resuscitation, repeat a.m. lab work. Followup with pending labs and nephrology consultations. 2. Hyperkalemia. Could be secondary to renal failure in combination with oral potassium supplementation as well as ARB therapy. Hyperkalemia now resolved, a.m. lab work showing a potassium level of 4.8. 3. Metabolic acidosis. I suspect secondary to acute kidney injury. 4. Type 2 diabetes mellitus. Due to renal failure metformin has been discontinued. Continue Accu-Cheks with sliding scale coverage. A.m. blood sugar of 122 on 11/07/2013. 5. History of deep venous thrombosis. Patient anticoagulated. A.m. lab work showing a supratherapeutic INR of 3.8, pharmacy consultation for warfarin management  Code Status: Full code  Family Communication: Family members not present  Disposition Plan: Continue IV fluids   Consultants:  Nephrology  Procedures:  Bilateral renal ultrasound performed on 11/06/2013 show no evidence of hydronephrosis   HPI/Subjective: Patient is a 68 year old female with a past medical history of type 2 diabetes mellitus, history of DVT, presently on chronic anticoagulation who was admitted to the medicine service on 11/06/2013. She had presented to [facility] where lab work revealed an elevated creatinine of 3 and a potassium of 7, administered D. 50 with insulin, bicarbonate, kayexalate, Lasix.She reported having decreased appetite over the last several weeks  Transferred to [facility] for further management and treatment as well as a nephrology consultation.  On admission she was started on IV fluids. Bilateral renal ultrasound did not reveal evidence of hydronephrosis. On 11/07/2013 lab work showed a creatinine of 2.8 with a potassium of 4.6. She reported feeling better, tolerating by mouth intake, denied nausea, vomiting, fevers, chills.  Objective: Filed Vitals:   11/07/13 0305  BP: 126/61  Pulse: 103  Temp: 98.1 F (36.7 C)  Resp: 18    Intake/Output Summary (Last 24 hours) at 11/07/13 1009 Last data filed at 11/07/13 0500  Liu per 24 hour  Intake 1316.67 ml  Output   1000 ml  Net 316.67 ml   Filed Weights   11/06/13 1117 11/06/13 2014  Weight: 73.483 kg (162 lb) 70.534 kg (155 lb 8 oz)    Exam:   General:  Patient is in no acute distress, awake alert, oriented   Cardiovascular: Regular rate and rhythm normal S1-S2   Respiratory: Clear to auscultation   Abdomen: Soft nontender nondistended   Musculoskeletal: No edema  Data Reviewed: Basic Metabolic Panel:  Recent Labs Lab 11/06/13 1158 11/06/13 1715 11/07/13 0146  NA 135* 140 140  K 7.0* 5.2 4.6  CL 103 106 106  CO2 14* 15* 14*  GLUCOSE 62* 54* 122*  BUN 53* 50* 48*  CREATININE 3.30* 3.03* 2.85*  CALCIUM 10.0 9.5 9.5  PHOS  --   --  3.8   Liver Function Tests:  Recent Labs Lab 11/06/13 1158 11/07/13 0146  AST 23  --   ALT 16  --   ALKPHOS 141*  --   BILITOT 0.3  --   PROT 8.2  --   ALBUMIN 4.2 3.8    Recent Labs Lab 11/06/13 1158  LIPASE 57   No results found for this basename: AMMONIA,  in the last 168  hours CBC:  Recent Labs Lab 11/06/13 1158 11/07/13 0146  WBC 10.0 7.7  NEUTROABS 7.7  --   HGB 12.1 11.4*  HCT 36.0 32.6*  MCV 92.5 89.1  PLT 295 280   Cardiac Enzymes:  Recent Labs Lab 11/06/13 1158  TROPONINI <0.30   BNP (last 3 results) No results found for this basename: PROBNP,  in the last 8760 hours CBG:  Recent Labs Lab 11/06/13 1458 11/06/13 2136 11/07/13 0044 11/07/13 0729  GLUCAP 87 255* 132* 112*     No results found for this or any previous visit (from the past 240 hour(s)).   Studies: US Renal  11/07/2013   CLINICAL DATA Acute renal failure.  EXAM RENAL/URINARY TRACT ULTRASOUND COMPLETE  COMPARISON None.  FINDINGS Right Kidney:  Length: 9.3 cm. 6 mm cyst is seen in midpole. Increased echogenicity is noted suggesting medical renal disease. No hydronephrosis is noted.  Left Kidney:  Length: 10.7 cm. Renal parenchyma is slightly echogenic suggesting medical renal disease. No hydronephrosis is noted.  Bladder:  Foley catheter in place.  Bladder is not visualized.  IMPRESSION No hydronephrosis or renal obstruction is noted. Increased echogenicity of renal parenchyma is noted suggesting medical renal disease.  SIGNATURE  Electronically Signed   By: Sabino Dick M.D.   On: 11/07/2013 03:05    Scheduled Meds: . heparin  5,000 Units Subcutaneous 3 times per day  . insulin aspart  0-9 Units Subcutaneous 6 times per day  . sodium chloride  3 mL Intravenous Q12H   Continuous Infusions: . sodium chloride 1,000 mL (11/06/13 2243)    Active Problems:   Hyperkalemia   AKI (acute kidney injury)   Type II or unspecified type diabetes mellitus with unspecified complication, uncontrolled   Unspecified hypothyroidism    Time spent: 35 minutes    Kelvin Cellar  [facility] Pager [phone]. If 7PM-7AM, please contact night-coverage at [email or web address], password [facility] 11/07/2013, 10:09 AM  LOS: 1 day

## 2013-11-08 LAB — PROTIME-INR
INR: 2.06 — ABNORMAL HIGH (ref 0.00–1.49)
Prothrombin Time: 22.6 seconds — ABNORMAL HIGH (ref 11.6–15.2)

## 2013-11-08 LAB — BASIC METABOLIC PANEL
BUN: 26 mg/dL — AB (ref 6–23)
CALCIUM: 9 mg/dL (ref 8.4–10.5)
CO2: 16 mEq/L — ABNORMAL LOW (ref 19–32)
CREATININE: 1.57 mg/dL — AB (ref 0.50–1.10)
Chloride: 111 mEq/L (ref 96–112)
GFR, EST AFRICAN AMERICAN: 39 mL/min — AB (ref >90)
GFR, EST NON AFRICAN AMERICAN: 34 mL/min — AB (ref >90)
Glucose, Bld: 137 mg/dL — ABNORMAL HIGH (ref 70–99)
Potassium: 3.9 mEq/L (ref 3.7–5.3)
Sodium: 143 mEq/L (ref 137–147)

## 2013-11-08 LAB — RENAL FUNCTION PANEL
Albumin: 3.3 g/dL — ABNORMAL LOW (ref 3.5–5.2)
BUN: 25 mg/dL — ABNORMAL HIGH (ref 6–23)
CO2: 16 mEq/L — ABNORMAL LOW (ref 19–32)
Calcium: 9.1 mg/dL (ref 8.4–10.5)
Chloride: 111 mEq/L (ref 96–112)
Creatinine, Ser: 1.63 mg/dL — ABNORMAL HIGH (ref 0.50–1.10)
GFR calc Af Amer: 37 mL/min — ABNORMAL LOW (ref >90)
GFR calc non Af Amer: 32 mL/min — ABNORMAL LOW (ref >90)
Glucose, Bld: 137 mg/dL — ABNORMAL HIGH (ref 70–99)
Phosphorus: 3.4 mg/dL (ref 2.3–4.6)
Potassium: 4 mEq/L (ref 3.7–5.3)
Sodium: 143 mEq/L (ref 137–147)

## 2013-11-08 LAB — CBC
HCT: 30.8 % — ABNORMAL LOW (ref 36.0–46.0)
Hemoglobin: 10.5 g/dL — ABNORMAL LOW (ref 12.0–15.0)
MCH: 30.3 pg (ref 26.0–34.0)
MCHC: 34.1 g/dL (ref 30.0–36.0)
MCV: 89 fL (ref 78.0–100.0)
Platelets: 221 10*3/uL (ref 150–400)
RBC: 3.46 MIL/uL — ABNORMAL LOW (ref 3.87–5.11)
RDW: 14.1 % (ref 11.5–15.5)
WBC: 6.9 10*3/uL (ref 4.0–10.5)

## 2013-11-08 LAB — GLUCOSE, CAPILLARY
GLUCOSE-CAPILLARY: 215 mg/dL — AB (ref 70–99)
Glucose-Capillary: 124 mg/dL — ABNORMAL HIGH (ref 70–99)
Glucose-Capillary: 129 mg/dL — ABNORMAL HIGH (ref 70–99)
Glucose-Capillary: 156 mg/dL — ABNORMAL HIGH (ref 70–99)

## 2013-11-08 LAB — PROTEIN ELECTROPHORESIS, SERUM
ALBUMIN ELP: 54.1 % — AB (ref 55.8–66.1)
ALPHA-1-GLOBULIN: 4.4 % (ref 2.9–4.9)
Alpha-2-Globulin: 13.3 % — ABNORMAL HIGH (ref 7.1–11.8)
BETA GLOBULIN: 6.6 % (ref 4.7–7.2)
Beta 2: 8.2 % — ABNORMAL HIGH (ref 3.2–6.5)
Gamma Globulin: 13.4 % (ref 11.1–18.8)
M-Spike, %: NOT DETECTED g/dL
TOTAL PROTEIN ELP: 7.4 g/dL (ref 6.0–8.3)

## 2013-11-08 MED ORDER — INSULIN GLARGINE 100 UNIT/ML ~~LOC~~ SOLN: 30.0000 [IU] | Freq: Every day | SUBCUTANEOUS | Status: DC

## 2013-11-08 MED ORDER — SULFAMETHOXAZOLE-TMP DS 800-160 MG PO TABS: 1.0000 | ORAL_TABLET | Freq: Two times a day (BID) | ORAL | Status: DC

## 2013-11-08 MED ORDER — SODIUM BICARBONATE 650 MG PO TABS: 1300.0000 mg | ORAL_TABLET | Freq: Two times a day (BID) | ORAL | Status: AC

## 2013-11-08 MED ORDER — CARVEDILOL 6.25 MG PO TABS: 6.2500 mg | ORAL_TABLET | Freq: Two times a day (BID) | ORAL | Status: DC

## 2013-11-08 NOTE — Progress Notes (Addendum)
Nutrition Brief Note  Patient identified on the Malnutrition Screening Tool (MST) Report  Wt Readings from Last 15 Encounters:  11/06/13 155 lb 8 oz (70.534 kg)    Body mass index is 26.29 kg/(m^2). Patient meets criteria for overweight based on current BMI.   Current diet order is renal/carb mod with 1200 ml fluid restriction, patient is consuming approximately 60-95% of meals at this time. Labs and medications reviewed.   Pt says that her appetite was poor prior to admission, but that she has been eating well since being admitted to the hospital. She says that she does not need any nutritional supplements at this time, "just food."  No nutrition interventions warranted at this time. If nutrition issues arise, please consult RD.   Terrace Arabia RD, LDN

## 2013-11-08 NOTE — Progress Notes (Signed)
S: No CO.  Wants to go home O:BP 124/73  Pulse 85  Temp(Src) 98.6 F (37 C) (Oral)  Resp 16  Ht 5' 4.5" (1.638 m)  Wt 70.534 kg (155 lb 8 oz)  BMI 26.29 kg/m2  SpO2 99%  Intake/Output Summary (Last 24 hours) at 11/08/13 1016 Last data filed at 11/08/13 1009  Gross per 24 hour  Intake    480 ml  Output   1050 ml  Net   -570 ml   Weight change:  XHF:SFSEL and alert CVS:RRR Resp:clear Abd:+ BS NTND Ext:no edema NEURO:CNI M&SI Ox3 no asterixis GU  No blood in foley   . insulin aspart  0-9 Units Subcutaneous 6 times per day  . sodium bicarbonate  1,300 mg Oral BID  . sodium chloride  3 mL Intravenous Q12H  . Warfarin - Pharmacist Dosing Inpatient   Does not apply q1800   US Renal  11/07/2013   CLINICAL DATA Acute renal failure.  EXAM RENAL/URINARY TRACT ULTRASOUND COMPLETE  COMPARISON None.  FINDINGS Right Kidney:  Length: 9.3 cm. 6 mm cyst is seen in midpole. Increased echogenicity is noted suggesting medical renal disease. No hydronephrosis is noted.  Left Kidney:  Length: 10.7 cm. Renal parenchyma is slightly echogenic suggesting medical renal disease. No hydronephrosis is noted.  Bladder:  Foley catheter in place.  Bladder is not visualized.  IMPRESSION No hydronephrosis or renal obstruction is noted. Increased echogenicity of renal parenchyma is noted suggesting medical renal disease.  SIGNATURE  Electronically Signed   By: Sabino Dick M.D.   On: 11/07/2013 03:05   BMET    Component Value Date/Time   NA 143 11/08/2013 0527   NA 143 11/08/2013 0527   K 3.9 11/08/2013 0527   K 4.0 11/08/2013 0527   CL 111 11/08/2013 0527   CL 111 11/08/2013 0527   CO2 16* 11/08/2013 0527   CO2 16* 11/08/2013 0527   GLUCOSE 137* 11/08/2013 0527   GLUCOSE 137* 11/08/2013 0527   BUN 26* 11/08/2013 0527   BUN 25* 11/08/2013 0527   CREATININE 1.57* 11/08/2013 0527   CREATININE 1.63* 11/08/2013 0527   CALCIUM 9.0 11/08/2013 0527   CALCIUM 9.1 11/08/2013 0527   GFRNONAA 34* 11/08/2013 0527   GFRNONAA  32* 11/08/2013 0527   GFRAA 39* 11/08/2013 0527   GFRAA 37* 11/08/2013 0527   CBC    Component Value Date/Time   WBC 6.9 11/08/2013 0527   RBC 3.46* 11/08/2013 0527   HGB 10.5* 11/08/2013 0527   HCT 30.8* 11/08/2013 0527   PLT 221 11/08/2013 0527   MCV 89.0 11/08/2013 0527   MCH 30.3 11/08/2013 0527   MCHC 34.1 11/08/2013 0527   RDW 14.1 11/08/2013 0527   LYMPHSABS 1.5 11/06/2013 1158   MONOABS 0.7 11/06/2013 1158   EOSABS 0.1 11/06/2013 1158   BASOSABS 0.0 11/06/2013 1158     Assessment: 1. Acute/subacute renal failure, renal fx improving. SPEP pending.  ANA neg.  ? If this was just volume depletion on ARB 2. Hyperkalemia, resolved 3. Met acidosis, on PO bicarb 4 HTN 5. Hematuria, probably sec foley trauma as UA prior to placement was NL  Plan: 1.DC IV fluids and foley 2. If plan is to DC today then I will arrange FU labs next week and appt to see me in 3-4 wks.  If you plan to keep her then order labs for AM  Daelon Dunivan T

## 2013-11-08 NOTE — Discharge Summary (Addendum)
Physician Discharge Summary  Sarah Liu BTD:[id number] DOB: November 22, 1945 DOA: 11/06/2013  PCP: Lilian Coma., MD  Admit date: 11/06/2013 Discharge date: 11/08/2013  Time spent: 35 minutes  Recommendations for Outpatient Follow-up:  1. Follow up on BMP in 1 week 2. Followup on blood pressures, ARB therapy discontinued during this hospitalization 3. Followup on blood sugars, metformin was discontinued during this hospitalization  Discharge Diagnoses:  Active Problems:   Hyperkalemia   AKI (acute kidney injury)   Type II or unspecified type diabetes mellitus with unspecified complication, uncontrolled   Unspecified hypothyroidism   Discharge Condition: Stable/Improved  Diet recommendation: Regular  Filed Weights   11/06/13 1117 11/06/13 2014 11/08/13 1342  Weight: 73.483 kg (162 lb) 70.534 kg (155 lb 8 oz) 72.031 kg (158 lb 12.8 oz)    History of present illness:  Who comes in with the cc of decreased appetite (duration 2 week). She was seen recently in an urgent care with a Cr of 2 (baseline 0.8) but refused further work up. She then presented to [facility]. There she was found to have a Cr of 3 and a K of 7. She was transferred here to have nephrology consultation.  Patient has DM and is somewhat uncontrolled. No SOB, no CP, no fever, no chills. Has been making good urine. 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 She had presented to [facility] where lab work revealed an elevated creatinine of 3 and a potassium of 7, administered D. 50 with insulin, bicarbonate, kayexalate, Lasix.She reported having decreased appetite over the last several weeks Transferred to [facility] for further management  and treatment as well as a nephrology consultation. On admission she was started on IV fluids. Bilateral renal ultrasound did not reveal evidence of hydronephrosis. On 11/07/2013 lab work showed a creatinine of 2.8 with a potassium of 4.6. She reported feeling better, tolerating by mouth intake, denied nausea, vomiting, fevers, chills. IV fluids were continued with repeat BMP on 11/08/2013 showing ongoing improvement, as creatinine trended down to 1.54. She reported feeling much better anticipate being discharged home on this date. Prior to discharge I discussed case with nephrology who recommended the discontinuation of ARB therapy, Lasix, potassium supplementation, metformin and discharging patient on oral bicarbonate. She was discharged home on 11/08/2013.     Consultations:  Nephrology  Discharge Exam: Filed Vitals:   11/08/13 1230  BP: 147/95  Pulse: 108  Temp: 97.9 F (36.6 C)  Resp: 17    General: Patient is in no acute distress, awake alert, oriented  Cardiovascular: Regular rate and rhythm normal S1-S2  Respiratory: Clear to auscultation  Abdomen: Soft nontender nondistended  Musculoskeletal: No edema   Discharge Instructions      Discharge Orders   Future Orders Complete By Expires   Call MD for:  extreme fatigue  As directed    Call MD for:  persistant dizziness or light-headedness  As directed    Call MD for:  persistant nausea and vomiting  As directed    Call MD for:  severe uncontrolled pain  As directed    Call MD for:  temperature >100.4  As directed    Diet - low sodium heart healthy  As directed  Increase activity slowly  As directed        Medication List    STOP taking these medications       furosemide 20 MG tablet  Commonly known as:  LASIX     losartan 100 MG tablet  Commonly known as:  COZAAR     metFORMIN 850 MG tablet  Commonly known as:  GLUCOPHAGE     POTASSIMIN PO     Vitamin D2 2000 UNITS Tabs      TAKE these medications        aspirin EC 81 MG tablet  Take 81 mg by mouth daily.     atorvastatin 40 MG tablet  Commonly known as:  LIPITOR  Take 40 mg by mouth daily.     carvedilol 6.25 MG tablet  Commonly known as:  COREG  Take 1 tablet (6.25 mg total) by mouth 2 (two) times daily with a meal.     HAIR/SKIN/NAILS PO  Take 1 tablet by mouth daily.     insulin glargine 100 UNIT/ML injection  Commonly known as:  LANTUS  Inject 0.3 mLs (30 Units total) into the skin at bedtime.     meclizine 25 MG tablet  Commonly known as:  ANTIVERT  Take 25 mg by mouth daily as needed for dizziness.     omeprazole 20 MG capsule  Commonly known as:  PRILOSEC  Take 20 mg by mouth daily.     sodium bicarbonate 650 MG tablet  Take 2 tablets (1,300 mg total) by mouth 2 (two) times daily.     sulfamethoxazole-trimethoprim 800-160 MG per tablet  Commonly known as:  BACTRIM DS  Take 1 tablet by mouth 2 (two) times daily.     warfarin 5 MG tablet  Commonly known as:  COUMADIN  Take 2.5-5 mg by mouth daily. Take 1 tablet (5mg ) on Mondays and 1/2 tablet (2.5 mg) on all other days.       Allergies  Allergen Reactions  . Latex Hives  . Penicillins Hives   Follow-up Information   Follow up with JOBE,DANIEL B., MD In 1 week.   Specialty:  Internal Medicine   Contact information:   [address] [place] [address] [phone]       Follow up with Windy Kalata, MD In 1 week.   Specialty:  Nephrology   Contact information:   [place] [address] [phone]        The results of significant diagnostics from this hospitalization (including imaging, microbiology, ancillary and laboratory) are listed below for reference.    Significant Diagnostic Studies: US Renal  11/07/2013   CLINICAL DATA Acute renal failure.  EXAM RENAL/URINARY TRACT ULTRASOUND COMPLETE  COMPARISON None.  FINDINGS Right Kidney:  Length: 9.3 cm. 6 mm cyst is seen in midpole. Increased echogenicity is noted suggesting  medical renal disease. No hydronephrosis is noted.  Left Kidney:  Length: 10.7 cm. Renal parenchyma is slightly echogenic suggesting medical renal disease. No hydronephrosis is noted.  Bladder:  Foley catheter in place.  Bladder is not visualized.  IMPRESSION No hydronephrosis or renal obstruction is noted. Increased echogenicity of renal parenchyma is noted suggesting medical renal disease.  SIGNATURE  Electronically Signed   By: Sabino Dick M.D.   On: 11/07/2013 03:05    Microbiology: No results found for this or any previous visit (from the past 240 hour(s)).   Labs: Basic Metabolic Panel:  Recent Labs Lab 11/06/13 1158 11/06/13 1715 11/07/13 0146 11/08/13 7062  NA 135* 140 140 143  143  K 7.0* 5.2 4.6 3.9  4.0  CL 103 106 106 111  111  CO2 14* 15* 14* 16*  16*  GLUCOSE 62* 54* 122* 137*  137*  BUN 53* 50* 48* 26*  25*  CREATININE 3.30* 3.03* 2.85* 1.57*  1.63*  CALCIUM 10.0 9.5 9.5 9.0  9.1  PHOS  --   --  3.8 3.4   Liver Function Tests:  Recent Labs Lab 11/06/13 1158 11/07/13 0146 11/08/13 0527  AST 23  --   --   ALT 16  --   --   ALKPHOS 141*  --   --   BILITOT 0.3  --   --   PROT 8.2  --   --   ALBUMIN 4.2 3.8 3.3*    Recent Labs Lab 11/06/13 1158  LIPASE 57   No results found for this basename: AMMONIA,  in the last 168 hours CBC:  Recent Labs Lab 11/06/13 1158 11/07/13 0146 11/08/13 0527  WBC 10.0 7.7 6.9  NEUTROABS 7.7  --   --   HGB 12.1 11.4* 10.5*  HCT 36.0 32.6* 30.8*  MCV 92.5 89.1 89.0  PLT 295 280 221   Cardiac Enzymes:  Recent Labs Lab 11/06/13 1158  TROPONINI <0.30   BNP: BNP (last 3 results) No results found for this basename: PROBNP,  in the last 8760 hours CBG:  Recent Labs Lab 11/07/13 2050 11/08/13 0038 11/08/13 0423 11/08/13 0745 11/08/13 1146  GLUCAP 188* 156* 124* 129* 215*       Signed:  Kelvin Cellar  [facility] 11/08/2013, 3:36 PM

## 2013-11-08 NOTE — Progress Notes (Signed)
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 Latex Hives  . Penicillins Hives    Patient Measurements: Height: 5' 4.5" (163.8 cm) Weight: 155 lb 8 oz (70.534 kg) IBW/kg (Calculated) : 55.85 Heparin Dosing Weight: n/a  Vital Signs: Temp: 98.6 F (37 C) (03/12 0826) Temp src: Oral (03/12 0826) BP: 124/73 mmHg (03/12 0826) Pulse Rate: 85 (03/12 0826)  Labs:  Recent Labs  11/06/13 1158 11/06/13 1715 11/07/13 0145 11/07/13 0146 11/08/13 0527 11/08/13 0910  HGB 12.1  --   --  11.4* 10.5*  --   HCT 36.0  --   --  32.6* 30.8*  --   PLT 295  --   --  280 221  --   LABPROT  --   --  36.0*  --   --  22.6*  INR  --   --  3.80*  --   --  2.06*  CREATININE 3.30* 3.03*  --  2.85* 1.57*  1.63*  --   TROPONINI <0.30  --   --   --   --   --     Estimated Creatinine Clearance: 34.8 ml/min (by C-G formula based on Cr of 1.57).   Assessment: 31 YOF with decreased appetite x 2 weeks. On coumadin therapy for h/o DVT and PVD. INR at admission was 3.8 and the dose was held but it has trended down significantly today to 2.06 and is currently sub-therapeutic. She gets her coumadin therapy managed at [facility].    Home coumadin dose: 5 mg on Monday; 2.5 mg on all other days   Goal of Therapy:  INR 2.5-3.5 (Per [facility]) Monitor platelets by anticoagulation protocol: Yes   Plan:  1) Coumadin 5 mg x 1 dose today  2) Monitor daily INR and s/s of bleeding. 3) If considering discharge, give coumadin 5 mg today for boost dose and then discharge on lower dose of 2.5 mg daily.    Albertina Parr, PharmD.  Clinical Pharmacist Pager [phone]

## 2017-04-27 ENCOUNTER — Encounter (HOSPITAL_BASED_OUTPATIENT_CLINIC_OR_DEPARTMENT_OTHER): Payer: Self-pay | Admitting: Emergency Medicine

## 2017-04-27 ENCOUNTER — Emergency Department (HOSPITAL_BASED_OUTPATIENT_CLINIC_OR_DEPARTMENT_OTHER)
Admission: EM | Admit: 2017-04-27 | Discharge: 2017-04-27 | Disposition: A | Discharge status: Home / Self Care | Payer: Medicare Other | Attending: Emergency Medicine | Admitting: Emergency Medicine

## 2017-04-27 ENCOUNTER — Emergency Department (HOSPITAL_BASED_OUTPATIENT_CLINIC_OR_DEPARTMENT_OTHER): Payer: Medicare Other

## 2017-04-27 DIAGNOSIS — I252 Old myocardial infarction: Secondary | ICD-10-CM | POA: Diagnosis not present

## 2017-04-27 DIAGNOSIS — Z7982 Long term (current) use of aspirin: Secondary | ICD-10-CM | POA: Diagnosis not present

## 2017-04-27 DIAGNOSIS — I509 Heart failure, unspecified: Secondary | ICD-10-CM | POA: Insufficient documentation

## 2017-04-27 DIAGNOSIS — Z8507 Personal history of malignant neoplasm of pancreas: Secondary | ICD-10-CM | POA: Diagnosis not present

## 2017-04-27 DIAGNOSIS — Z794 Long term (current) use of insulin: Secondary | ICD-10-CM | POA: Insufficient documentation

## 2017-04-27 DIAGNOSIS — Z91040 Latex allergy status: Secondary | ICD-10-CM | POA: Diagnosis not present

## 2017-04-27 DIAGNOSIS — E109 Type 1 diabetes mellitus without complications: Secondary | ICD-10-CM | POA: Insufficient documentation

## 2017-04-27 DIAGNOSIS — Z7901 Long term (current) use of anticoagulants: Secondary | ICD-10-CM | POA: Insufficient documentation

## 2017-04-27 DIAGNOSIS — L089 Local infection of the skin and subcutaneous tissue, unspecified: Secondary | ICD-10-CM

## 2017-04-27 DIAGNOSIS — E039 Hypothyroidism, unspecified: Secondary | ICD-10-CM | POA: Insufficient documentation

## 2017-04-27 DIAGNOSIS — M79675 Pain in left toe(s): Secondary | ICD-10-CM | POA: Diagnosis present

## 2017-04-27 DIAGNOSIS — Z87891 Personal history of nicotine dependence: Secondary | ICD-10-CM | POA: Insufficient documentation

## 2017-04-27 MED ORDER — CEPHALEXIN 500 MG PO CAPS: 500.0000 mg | ORAL_CAPSULE | Freq: Four times a day (QID) | ORAL | 0 refills | Status: DC

## 2017-04-27 MED ORDER — SULFAMETHOXAZOLE-TRIMETHOPRIM 800-160 MG PO TABS: 1.0000 | ORAL_TABLET | Freq: Two times a day (BID) | ORAL | 0 refills | Status: AC

## 2017-04-27 NOTE — ED Notes (Addendum)
Patient stated that she think she drop something on her foot 2 weeks ago.  It started as a blister and pt stated that the blister burst.  Slight necrotic tissue noted to affected area.

## 2017-04-27 NOTE — Discharge Instructions (Signed)
Follow up with your podiatrist, or if you prefer call our podiatrist and make an appointment to be evaluated.  Warm soaks 4x a day for about 5-10 min to keep this open and draining

## 2017-04-27 NOTE — ED Triage Notes (Addendum)
Patient reports injury to left second toe 4 days ago.  Reports she is a diabetic with blood glucose under control.  Purulent drainage noted to toe.

## 2017-04-27 NOTE — ED Provider Notes (Signed)
[facility] DEPT [facility] Provider Note   CSN: [id number] Arrival date & time: 04/27/17  1538     History   Chief Complaint Chief Complaint  Patient presents with  . Toe Injury    HPI Sarah Liu is a 71 y.o. female.  71 yo F with a chief complaint of an injury to her left second toe. The patient dropped something on it about a week ago. She is unsure what it is. Since then has had some skin breakdown started as a blister and then ruptured. She thinks she saw some purulent drainage earlier today. Denies fevers or chills. Denies nausea or vomiting. History of a significant vascular bypass on that side. She stated her vascular surgeon started her on doxycycline every day as prophylaxis.   The history is provided by the patient.  Illness  This is a new problem. The current episode started more than 1 week ago. The problem occurs constantly. The problem has been gradually worsening. Pertinent negatives include no chest pain, no headaches and no shortness of breath. Nothing aggravates the symptoms. Nothing relieves the symptoms. She has tried nothing for the symptoms. The treatment provided no relief.    Past Medical History:  Diagnosis Date  . Anemia 2012  . CHF (congestive heart failure) ([place])   . DVT (deep venous thrombosis) ([place]) 2011; 2012   left arm; left leg  . Heart attack ([place])    91, 92  . Heart murmur   . High cholesterol   . History of stomach ulcers    & jejunum9/2003  . Hypothyroidism 03/2001  . Pancreatic cancer ([place] [place]) 08/2000  . Perforated ulcer ([place])   . Type I diabetes mellitus ([place]) 01/2001    Patient Active Problem List   Diagnosis Date Noted  . Hyperkalemia 11/06/2013  . AKI (acute kidney injury) ([place]) 11/06/2013  . Type II or unspecified type diabetes mellitus with unspecified complication, uncontrolled 11/06/2013  . Unspecified hypothyroidism 11/06/2013    Past Surgical History:  Procedure Laterality Date  . CARDIAC CATHETERIZATION  1991; 1992    . CHOLECYSTECTOMY  1996  . PERIPHERAL ARTERIAL STENT GRAFT Right 09/2010   "leg"  . REPAIR OF PERFORATED ULCER  01/2000  . TONSILLECTOMY AND ADENOIDECTOMY  1960's  . VAGINAL HYSTERECTOMY  1994  . VASCULAR SURGERY    . WHIPPLE PROCEDURE  08/2000    OB History    No data available       Home Medications    Prior to Admission medications   Medication Sig Start Date End Date Taking? Authorizing Provider  CALCITRIOL PO Take by mouth.   Yes [provider]  doxycycline (DORYX) 100 MG EC tablet Take 100 mg by mouth 2 (two) times daily.   Yes [provider]  traZODone (DESYREL) 50 MG tablet Take 50 mg by mouth at bedtime.   Yes [provider]  aspirin EC 81 MG tablet Take 81 mg by mouth daily.    [provider]  atorvastatin (LIPITOR) 40 MG tablet Take 40 mg by mouth daily.    [provider]  carvedilol (COREG) 6.25 MG tablet Take 1 tablet (6.25 mg total) by mouth 2 (two) times daily with a meal. 11/08/13   Kelvin Cellar, MD  cephALEXin (KEFLEX) 500 MG capsule Take 1 capsule (500 mg total) by mouth 4 (four) times daily. 04/27/17   Deno Etienne, DO  insulin glargine (LANTUS) 100 UNIT/ML injection Inject 0.3 mLs (30 Units total) into the skin at bedtime. 11/08/13  Kelvin Cellar, MD  meclizine (ANTIVERT) 25 MG tablet Take 25 mg by mouth daily as needed for dizziness.    [provider]  Multiple Vitamins-Minerals (HAIR/SKIN/NAILS PO) Take 1 tablet by mouth daily.    [provider]  omeprazole (PRILOSEC) 20 MG capsule Take 20 mg by mouth daily.    [provider]  sodium bicarbonate 650 MG tablet Take 2 tablets (1,300 mg total) by mouth 2 (two) times daily. 11/08/13   Kelvin Cellar, MD  sulfamethoxazole-trimethoprim (BACTRIM DS,SEPTRA DS) 800-160 MG tablet Take 1 tablet by mouth 2 (two) times daily. 04/27/17 05/04/17  Deno Etienne, DO  warfarin (COUMADIN) 5 MG tablet Take 2.5-5 mg by mouth daily. Take 1 tablet (5mg ) on  Mondays and 1/2 tablet (2.5 mg) on all other days.    [provider]    Family History History reviewed. No pertinent family history.  Social History Social History  Substance Use Topics  . Smoking status: Former Smoker    Packs/day: 1.00    Years: 30.00    Types: Cigarettes  . Smokeless tobacco: Never Used     Comment: 11/06/2013 "quit smoking cigarettes in 2011"  . Alcohol use Yes     Comment: 11/06/2013 "last drink > 5 yr ago; never had problem w/it"     Allergies   Latex and Penicillins   Review of Systems Review of Systems  Constitutional: Negative for chills and fever.  HENT: Negative for congestion and rhinorrhea.   Eyes: Negative for redness and visual disturbance.  Respiratory: Negative for shortness of breath and wheezing.   Cardiovascular: Negative for chest pain and palpitations.  Gastrointestinal: Negative for nausea and vomiting.  Genitourinary: Negative for dysuria and urgency.  Musculoskeletal: Positive for arthralgias. Negative for myalgias.  Skin: Negative for pallor and wound.  Neurological: Negative for dizziness and headaches.     Physical Exam Updated Vital Signs BP (!) 154/73 (BP Location: Right Arm)   Pulse 62   Temp (!) 97.5 F (36.4 C) (Axillary)   Resp 16   Ht 5' 4.5" (1.638 m)   Wt 67.6 kg (149 lb)   SpO2 100%   BMI 25.18 kg/m   Physical Exam  Constitutional: She is oriented to person, place, and time. She appears well-developed and well-nourished. No distress.  HENT:  Head: Normocephalic and atraumatic.  Eyes: Pupils are equal, round, and reactive to light. EOM are normal.  Neck: Normal range of motion. Neck supple.  Cardiovascular: Normal rate and regular rhythm.  Exam reveals no gallop and no friction rub.   No murmur heard. Pulmonary/Chest: Effort normal. She has no wheezes. She has no rales.  Abdominal: Soft. She exhibits no distension and no mass. There is no tenderness. There is no guarding.  Musculoskeletal: She  exhibits no edema or tenderness.  Skin breakdown to left second digit, fx of nail.  Skin breakdown noted with some serous drainage.  No significant tenderness. No necrosis.    Faint palpable DP pulse equal bilaterally  Neurological: She is alert and oriented to person, place, and time.  Skin: Skin is warm and dry. She is not diaphoretic.  Psychiatric: She has a normal mood and affect. Her behavior is normal.  Nursing note and vitals reviewed.    ED Treatments / Results  Labs (all labs ordered are listed, but only abnormal results are displayed) Labs Reviewed - No data to display  EKG  EKG Interpretation None       Radiology Dg Foot Complete Left  Result  Date: 04/27/2017 CLINICAL DATA:  Initial evaluation for left toe pain, swelling with blister on left second toe. Recent trauma. EXAM: LEFT FOOT - COMPLETE 3+ VIEW COMPARISON:  None. FINDINGS: No acute fracture or dislocation. 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 Osteopenia. Posterior plantar calcaneal enthesophytes. Mild diffuse soft tissue swelling about the foot. No radiopaque foreign body. IMPRESSION: 1. Mild diffuse soft tissue swelling about the foot. 2. No acute osseous abnormality. 3. No radiopaque foreign body. Electronically Signed   By: Jeannine Boga M.D.   On: 04/27/2017 18:06    Procedures Procedures (including critical care time)  Medications Ordered in ED Medications - No data to display   Initial Impression / Assessment and Plan / ED Course  I have reviewed the triage vital signs and the nursing notes.  Pertinent labs & imaging results that were available during my care of the patient were reviewed by me and considered in my medical decision making (see chart for details).     71 yo F With a chief complaints of left second toe pain after an injury. I'm unsure of its infected clinically. She is on doxycycline already. Will add Keflex and Bactrim. Have her follow-up with  a podiatrist. Obtain a plain film to evaluate for fracture.   Xray negative.  D/c home.   11:45 PM:  I have discussed the diagnosis/risks/treatment options with the patient and family and believe the pt to be eligible for discharge home to follow-up with PCP. We also discussed returning to the ED immediately if new or worsening sx occur. We discussed the sx which are most concerning (e.g., sudden worsening pain, fever, inability to tolerate by mouth) that necessitate immediate return. Medications administered to the patient during their visit and any new prescriptions provided to the patient are listed below.  Medications given during this visit Medications - No data to display   The patient appears reasonably screen and/or stabilized for discharge and I doubt any other medical condition or other [facility] requiring further screening, evaluation, or treatment in the ED at this time prior to discharge.    Final Clinical Impressions(s) / ED Diagnoses   Final diagnoses:  Toe infection    New Prescriptions Discharge Medication List as of 04/27/2017  6:11 PM    START taking these medications   Details  cephALEXin (KEFLEX) 500 MG capsule Take 1 capsule (500 mg total) by mouth 4 (four) times daily., Starting Wed 04/27/2017, [place], Linna Hoff, DO 04/27/17 2345

## 2017-04-27 NOTE — ED Notes (Signed)
Dry dressing applied to left foot wound.

## 2017-09-13 ENCOUNTER — Emergency Department (HOSPITAL_COMMUNITY)
Admission: EM | Admit: 2017-09-13 | Discharge: 2017-09-13 | Disposition: A | Discharge status: Home / Self Care | Payer: Medicare Other | Attending: Emergency Medicine | Admitting: Emergency Medicine

## 2017-09-13 ENCOUNTER — Other Ambulatory Visit: Payer: Self-pay

## 2017-09-13 ENCOUNTER — Emergency Department (HOSPITAL_COMMUNITY): Payer: Medicare Other

## 2017-09-13 DIAGNOSIS — Z79899 Other long term (current) drug therapy: Secondary | ICD-10-CM | POA: Insufficient documentation

## 2017-09-13 DIAGNOSIS — Z87891 Personal history of nicotine dependence: Secondary | ICD-10-CM | POA: Insufficient documentation

## 2017-09-13 DIAGNOSIS — Z7901 Long term (current) use of anticoagulants: Secondary | ICD-10-CM | POA: Diagnosis not present

## 2017-09-13 DIAGNOSIS — E039 Hypothyroidism, unspecified: Secondary | ICD-10-CM | POA: Insufficient documentation

## 2017-09-13 DIAGNOSIS — I509 Heart failure, unspecified: Secondary | ICD-10-CM | POA: Insufficient documentation

## 2017-09-13 DIAGNOSIS — N390 Urinary tract infection, site not specified: Secondary | ICD-10-CM | POA: Diagnosis not present

## 2017-09-13 DIAGNOSIS — Z794 Long term (current) use of insulin: Secondary | ICD-10-CM | POA: Diagnosis not present

## 2017-09-13 DIAGNOSIS — E10649 Type 1 diabetes mellitus with hypoglycemia without coma: Secondary | ICD-10-CM | POA: Diagnosis not present

## 2017-09-13 DIAGNOSIS — R4182 Altered mental status, unspecified: Secondary | ICD-10-CM | POA: Diagnosis present

## 2017-09-13 DIAGNOSIS — E162 Hypoglycemia, unspecified: Secondary | ICD-10-CM

## 2017-09-13 LAB — URINALYSIS, ROUTINE W REFLEX MICROSCOPIC
BILIRUBIN URINE: NEGATIVE
Glucose, UA: NEGATIVE mg/dL
HGB URINE DIPSTICK: NEGATIVE
Ketones, ur: NEGATIVE mg/dL
NITRITE: NEGATIVE
PH: 7 (ref 5.0–8.0)
Protein, ur: NEGATIVE mg/dL
SPECIFIC GRAVITY, URINE: 1.021 (ref 1.005–1.030)
Squamous Epithelial / LPF: NONE SEEN

## 2017-09-13 LAB — CBC WITH DIFFERENTIAL/PLATELET
BASOS ABS: 0 10*3/uL (ref 0.0–0.1)
BASOS PCT: 0 %
Eosinophils Absolute: 0.1 10*3/uL (ref 0.0–0.7)
Eosinophils Relative: 0 %
HEMATOCRIT: 37.2 % (ref 36.0–46.0)
HEMOGLOBIN: 11.8 g/dL — AB (ref 12.0–15.0)
LYMPHS PCT: 9 %
Lymphs Abs: 1.4 10*3/uL (ref 0.7–4.0)
MCH: 28.7 pg (ref 26.0–34.0)
MCHC: 31.7 g/dL (ref 30.0–36.0)
MCV: 90.5 fL (ref 78.0–100.0)
MONO ABS: 0.6 10*3/uL (ref 0.1–1.0)
Monocytes Relative: 4 %
NEUTROS ABS: 13.9 10*3/uL (ref 1.7–7.7)
NEUTROS PCT: 87 %
Platelets: 615 10*3/uL — ABNORMAL HIGH (ref 150–400)
RBC: 4.11 MIL/uL (ref 3.87–5.11)
RDW: 17.6 % — ABNORMAL HIGH (ref 11.5–15.5)
WBC: 16 10*3/uL — ABNORMAL HIGH (ref 4.0–10.5)

## 2017-09-13 LAB — BASIC METABOLIC PANEL
ANION GAP: 13 (ref 5–15)
BUN: 21 mg/dL — ABNORMAL HIGH (ref 6–20)
CALCIUM: 8.8 mg/dL — AB (ref 8.9–10.3)
CO2: 28 mmol/L (ref 22–32)
Chloride: 97 mmol/L — ABNORMAL LOW (ref 101–111)
Creatinine, Ser: 0.75 mg/dL (ref 0.44–1.00)
GLUCOSE: 126 mg/dL — AB (ref 65–99)
Potassium: 3.4 mmol/L — ABNORMAL LOW (ref 3.5–5.1)
Sodium: 138 mmol/L (ref 135–145)

## 2017-09-13 LAB — CBG MONITORING, ED
GLUCOSE-CAPILLARY: 73 mg/dL (ref 65–99)
GLUCOSE-CAPILLARY: 169 mg/dL — AB (ref 65–99)

## 2017-09-13 MED ORDER — SULFAMETHOXAZOLE-TRIMETHOPRIM 800-160 MG PO TABS
1.0000 | ORAL_TABLET | Freq: Once | ORAL | Status: AC
  Administered 2017-09-13: 1 via ORAL
  Filled 2017-09-13: qty 1

## 2017-09-13 MED ORDER — ACETAMINOPHEN 500 MG PO TABS
500.0000 mg | ORAL_TABLET | Freq: Once | ORAL | Status: AC
  Administered 2017-09-13: 500 mg via ORAL
  Filled 2017-09-13: qty 1

## 2017-09-13 MED ORDER — OXYCODONE HCL 5 MG PO TABS
5.0000 mg | ORAL_TABLET | Freq: Once | ORAL | Status: AC
  Administered 2017-09-13: 5 mg via ORAL
  Filled 2017-09-13: qty 1

## 2017-09-13 MED ORDER — SULFAMETHOXAZOLE-TRIMETHOPRIM 800-160 MG PO TABS: 1.0000 | ORAL_TABLET | Freq: Two times a day (BID) | ORAL | 0 refills | Status: AC

## 2017-09-13 MED ORDER — MEGESTROL ACETATE 625 MG/5ML PO SUSP: 625.0000 mg | Freq: Every day | ORAL | 0 refills | Status: AC

## 2017-09-13 NOTE — ED Provider Notes (Signed)
[facility] DEPT Provider Note   CSN: [id number] Arrival date & time: 09/13/17  1742     History   Chief Complaint Chief Complaint  Patient presents with  . Hypoglycemia  . Altered Mental Status    HPI Sarah Liu is a 72 y.o. female.  The history is provided by the patient and medical records. No language interpreter was used.  Hypoglycemia  Associated symptoms: altered mental status and weakness (Resolved)   Associated symptoms: no dizziness   Altered Mental Status   Associated symptoms include weakness (Resolved).   Sarah Liu is a 72 y.o. female  with a PMH of CHF, DM who presents to the Emergency Department from [facility] for evaluation of mental status change. Per EMS, they were called for mental status change. When they got to facility, she had sugar jelly on her lips like someone had tried to give her something PO. CBG was checked at the scene at 61. 1mg  glucagon given and patient became more alert throughout transfer to ED. Patient and family both report facility managing medications and do not feel it possible that she took too much of DM meds. Family member at bedside states that she often has had low sugars in the mornings. She has tried to discuss this with facility and feels that patient needs a night-time snack, however facility has not been doing this despite her requests. No recent fever or chills. No chest pain or shortness of breath. Denies abdominal pain, n/v or urinary symptoms. Currently feels back to her baseline with no complaints.   Past Medical History:  Diagnosis Date  . Anemia 2012  . CHF (congestive heart failure) ([place])   . DVT (deep venous thrombosis) ([place]) 2011; 2012   left arm; left leg  . Heart attack ([place])    91, 92  . Heart murmur   . High cholesterol   . History of stomach ulcers    & jejunum9/2003  . Hypothyroidism 03/2001  . Pancreatic cancer ([place]) 08/2000  . Perforated ulcer ([place])   . Type I  diabetes mellitus ([place]) 01/2001    Patient Active Problem List   Diagnosis Date Noted  . Hyperkalemia 11/06/2013  . AKI (acute kidney injury) ([place]) 11/06/2013  . Type II or unspecified type diabetes mellitus with unspecified complication, uncontrolled 11/06/2013  . Unspecified hypothyroidism 11/06/2013    Past Surgical History:  Procedure Laterality Date  . CARDIAC CATHETERIZATION  1991; 1992  . CHOLECYSTECTOMY  1996  . PERIPHERAL ARTERIAL STENT GRAFT Right 09/2010   "leg"  . REPAIR OF PERFORATED ULCER  01/2000  . TONSILLECTOMY AND ADENOIDECTOMY  1960's  . VAGINAL HYSTERECTOMY  1994  . VASCULAR SURGERY    . WHIPPLE PROCEDURE  08/2000    OB History    No data available       Home Medications    Prior to Admission medications   Medication Sig Start Date End Date Taking? Authorizing Provider  acetaminophen (TYLENOL) 500 MG tablet Take 500 mg by mouth every 8 (eight) hours as needed.   Yes [provider]  acidophilus (RISAQUAD) CAPS capsule Take 1 capsule by mouth daily.   Yes [provider]  aspirin EC 81 MG tablet Take 81 mg by mouth daily.   Yes [provider]  atorvastatin (LIPITOR) 40 MG tablet Take 40 mg by mouth daily.   Yes [provider]  Benzocaine (HURRCAINE) 20 % AERO Use as directed 1 application in the mouth or throat.  Yes [provider]  CALCITRIOL PO Take 0.5 mcg by mouth daily.    Yes [provider]  carvedilol (COREG) 6.25 MG tablet Take 1 tablet (6.25 mg total) by mouth 2 (two) times daily with a meal. 11/08/13  Yes Kelvin Cellar, MD  FLUoxetine (PROZAC) 20 MG tablet Take 20 mg by mouth daily.  07/30/17  Yes [provider]  furosemide (LASIX) 40 MG tablet Take 20 mg by mouth daily. 11/29/16  Yes [provider]  gabapentin (NEURONTIN) 100 MG capsule Take 100 mg by mouth 3 (three) times daily. 07/29/17  Yes [provider]  insulin glargine (LANTUS) 100 UNIT/ML injection  Inject 0.3 mLs (30 Units total) into the skin at bedtime. Patient taking differently: Inject 10 Units into the skin at bedtime.  11/08/13  Yes Kelvin Cellar, MD  insulin lispro (HUMALOG) 100 UNIT/ML injection Inject into the skin 3 (three) times daily before meals.   Yes [provider]  iron polysaccharides (NIFEREX) 150 MG capsule Take 150 mg by mouth daily. 08/20/16  Yes [provider]  losartan (COZAAR) 50 MG tablet Take 50 mg by mouth daily. 09/12/17  Yes [provider]  magnesium oxide (MAG-OX) 400 MG tablet Take 400 mg by mouth 2 (two) times daily. 07/29/17  Yes [provider]  metroNIDAZOLE (FLAGYL) 500 MG tablet 500 mg every other day.    Yes [provider]  modafinil (PROVIGIL) 100 MG tablet Take 100 mg by mouth daily.   Yes [provider]  Multiple Vitamins-Minerals (HAIR/SKIN/NAILS PO) Take 1 tablet by mouth daily.   Yes [provider]  omeprazole (PRILOSEC) 20 MG capsule Take 20 mg by mouth daily.   Yes [provider]  oxyCODONE (OXY IR/ROXICODONE) 5 MG immediate release tablet Take 5 mg by mouth every 4 (four) hours as needed.   Yes [provider]  polyethylene glycol (MIRALAX / GLYCOLAX) packet Take 17 g by mouth daily as needed for mild constipation.  07/29/17  Yes [provider]  senna (SENOKOT) 8.6 MG TABS tablet Take 2 tablets by mouth daily as needed for mild constipation.   Yes [provider]  sodium bicarbonate 650 MG tablet Take 2 tablets (1,300 mg total) by mouth 2 (two) times daily. 11/08/13  Yes Kelvin Cellar, MD  traZODone (DESYREL) 100 MG tablet Take 100 mg by mouth at bedtime as needed for sleep.   Yes [provider]  cephALEXin (KEFLEX) 500 MG capsule Take 1 capsule (500 mg total) by mouth 4 (four) times daily. Patient not taking: Reported on 09/13/2017 04/27/17   Deno Etienne, DO  ciprofloxacin (CIPRO) 500 MG tablet Take 500 mg by mouth 2 (two) times  daily.    [provider]  doxycycline (DORYX) 100 MG EC tablet Take 100 mg by mouth 2 (two) times daily.    [provider]  IRON PO Take 1 tablet by mouth daily. 07/30/17   [provider]  meclizine (ANTIVERT) 25 MG tablet Take 25 mg by mouth daily as needed for dizziness.    [provider]  traZODone (DESYREL) 50 MG tablet Take 50 mg by mouth at bedtime.    [provider]  warfarin (COUMADIN) 5 MG tablet Take 2.5-5 mg by mouth daily. Take 1 tablet (5mg ) on Mondays and 1/2 tablet (2.5 mg) on all other days.    [provider]    Family History No family history on file.  Social History Social History   Tobacco Use  .  Smoking status: Former Smoker    Packs/day: 1.00    Years: 30.00    Pack years: 30.00    Types: Cigarettes  . Smokeless tobacco: Never Used  . Tobacco comment: 11/06/2013 "quit smoking cigarettes in 2011"  Substance Use Topics  . Alcohol use: Yes    Comment: 11/06/2013 "last drink > 5 yr ago; never had problem w/it"  . Drug use: No     Allergies   Latex and Penicillins   Review of Systems Review of Systems  Neurological: Positive for weakness (Resolved). Negative for dizziness, syncope and headaches.  All other systems reviewed and are negative.    Physical Exam Updated Vital Signs BP 124/67 (BP Location: Left Arm)   Pulse 88   Temp 97.9 F (36.6 C) (Oral)   Resp (!) 21   SpO2 95%   Physical Exam  Constitutional: She is oriented to person, place, and time. She appears well-developed and well-nourished. No distress.  HENT:  Head: Normocephalic and atraumatic.  Cardiovascular: Normal rate, regular rhythm and normal heart sounds.  No murmur heard. Pulmonary/Chest: Effort normal and breath sounds normal. No respiratory distress.  Abdominal: Soft. She exhibits no distension. There is no tenderness.  Musculoskeletal:  5/5 muscle strength and full ROM in all four extremities.  Neurological: She is  alert and oriented to person, place, and time.  Alert, oriented, thought content appropriate, able to give a coherent history. Speech is clear and goal oriented, able to follow commands.  Cranial Nerves:  II:  Peripheral visual fields grossly normal, pupils equal, round, reactive to light III, IV, VI: EOM intact bilaterally, ptosis not present V,VII: smile symmetric, eyes kept closed tightly against resistance, facial light touch sensation equal VIII: hearing grossly normal IX, X: symmetric soft palate movement, uvula elevates symmetrically  XI: bilateral shoulder shrug symmetric and strong XII: midline tongue extension Sensory to light touch normal in all four extremities.  Normal finger-to-nose and rapid alternating movements.  Skin: Skin is warm and dry.  Nursing note and vitals reviewed.    ED Treatments / Results  Labs (all labs ordered are listed, but only abnormal results are displayed) Labs Reviewed  BASIC METABOLIC PANEL - Abnormal; Notable for the following components:      Result Value   Potassium 3.4 (*)    Chloride 97 (*)    Glucose, Bld 126 (*)    BUN 21 (*)    Calcium 8.8 (*)    All other components within normal limits  CBC WITH DIFFERENTIAL/PLATELET - Abnormal; Notable for the following components:   WBC 16.0 (*)    Hemoglobin 11.8 (*)    RDW 17.6 (*)    Platelets 615 (*)    All other components within normal limits  URINALYSIS, ROUTINE W REFLEX MICROSCOPIC - Abnormal; Notable for the following components:   Color, Urine AMBER (*)    APPearance HAZY (*)    Leukocytes, UA LARGE (*)    Bacteria, UA RARE (*)    All other components within normal limits  CBG MONITORING, ED - Abnormal; Notable for the following components:   Glucose-Capillary 169 (*)    All other components within normal limits  URINE CULTURE  CBG MONITORING, ED    EKG  EKG Interpretation None       Radiology Ct Head Wo Contrast  Result Date: 09/13/2017 CLINICAL DATA:   Hypoglycemia, altered mental status. EXAM: CT HEAD WITHOUT CONTRAST TECHNIQUE: Contiguous axial images were obtained from the base of the skull through the  vertex without intravenous contrast. COMPARISON:  None. FINDINGS: Brain: Generalized age related parenchymal atrophy with commensurate dilatation of the ventricles and sulci. Chronic small vessel ischemic changes within the bilateral periventricular white matter regions and left basal ganglia. No mass, hemorrhage, edema or other evidence of acute parenchymal abnormality. No extra-axial hemorrhage. Vascular: There are chronic calcified atherosclerotic changes of the large vessels at the skull base. No unexpected hyperdense vessel. Skull: Normal. Negative for fracture or focal lesion. Sinuses/Orbits: No acute finding. Other: None. IMPRESSION: 1. No acute findings.  No intracranial mass, hemorrhage or edema. 2. Atrophy and chronic small-vessel ischemic changes, as detailed above. Electronically Signed   By: Franki Cabot M.D.   On: 09/13/2017 21:32    Procedures Procedures (including critical care time)  Medications Ordered in ED Medications  sulfamethoxazole-trimethoprim (BACTRIM DS,SEPTRA DS) 800-160 MG per tablet 1 tablet (not administered)  oxyCODONE (Oxy IR/ROXICODONE) immediate release tablet 5 mg (not administered)  acetaminophen (TYLENOL) tablet 500 mg (500 mg Oral Given 09/13/17 2155)     Initial Impression / Assessment and Plan / ED Course  I have reviewed the triage vital signs and the nursing notes.  Pertinent labs & imaging results that were available during my care of the patient were reviewed by me and considered in my medical decision making (see chart for details).    Sarah Liu is a 72 y.o. female who presents to ED for altered mental status / hypoglycemia. Given 1 mg glucagon en route and baseline mental status upon arrival per family at the room. No focal neuro deficits appreciated. CT head negative. Initial cbg of 73.  Patient given orange juice and meal tray which she tolerated fine. Repeat cbg several hours later 169. Labs reviewed. Does have leukocytosis of 16. UA with signs of UTI. She does have foley which has not been changed at facility for several weeks. Foley changed in ED today and will treat with bactrim. Family also requesting something to help stimulate appetite, will try megace. Evaluation does not show pathology that would require ongoing emergent intervention or inpatient treatment. Family aware of plan of care and return precautions. All questions answered.  Patient seen by and discussed with Dr. Gilford Raid who agrees with treatment plan.   Final Clinical Impressions(s) / ED Diagnoses   Final diagnoses:  Lower urinary tract infectious disease  Hypoglycemia    ED Discharge Orders    None       Ward, Ozella Almond, PA-C 09/13/17 2222    Isla Pence, MD 09/13/17 2312

## 2017-09-13 NOTE — ED Triage Notes (Signed)
Per EMS: Pt's sugar was 61 at the nursing home and was not acting correctly. Pt was given 1mg  glucagon and has been more alert. Pt is oriented to self only at this time.   Last Pressure was approximately 100 palpated.

## 2017-09-13 NOTE — ED Notes (Signed)
Bed: [facility] Expected date:  Expected time:  Means of arrival:  Comments: 

## 2017-09-13 NOTE — ED Notes (Signed)
Pt given a tray with MD approval. Pt currently eating fish and has drank 16oz of orange juice at this time.

## 2017-09-13 NOTE — Discharge Instructions (Signed)
It was my pleasure taking care of you today!   We have changed your foley catheter while you were in the ER today. Your urine did show signs of infection, so we have started you on an antibiotic. Please take this until completion. Take megace daily to help with your appetite.   Please follow up with your primary care provider for further evaluation and to ensure UTI resolution.   Return to ER for fevers, vomiting, new or worsening symptoms, any additional concerns.

## 2017-09-13 NOTE — ED Notes (Signed)
Pt requesting something for pain.  PA made aware. 

## 2017-09-13 NOTE — ED Notes (Signed)
[facility] notified of need for transport back to [facility]

## 2017-09-16 ENCOUNTER — Other Ambulatory Visit: Payer: Self-pay | Admitting: Family Medicine

## 2017-09-16 DIAGNOSIS — L89154 Pressure ulcer of sacral region, stage 4: Secondary | ICD-10-CM

## 2017-09-16 LAB — URINE CULTURE: Culture: >=100000 — AB

## 2017-09-17 ENCOUNTER — Telehealth: Payer: Self-pay

## 2017-09-17 NOTE — Telephone Encounter (Signed)
Post ED Visit - Positive Culture Follow-up: Unsuccessful Patient Follow-up  Culture assessed and recommendations reviewed by:  []  Elenor Quinones, Pharm.D. []  Heide Guile, Pharm.D., BCPS AQ-ID []  Parks Neptune, Pharm.D., BCPS []  Alycia Rossetti, Pharm.D., BCPS []  [place], Pharm.D., BCPS, AAHIVP []  Legrand Como, Pharm.D., BCPS, AAHIVP []  Salome Arnt, PharmD, BCPS []  Dimitri Ped, PharmD, BCPS []  Vincenza Hews, PharmD, BCPS [facility] Pharm D Positive urine culture  []  Patient discharged without antimicrobial prescription and treatment is now indicated [x]  Organism is resistant to prescribed ED discharge antimicrobial []  Patient with positive blood cultures   Unable to contact patient after 3 attempts, letter will be sent to address on file  Genia Del 09/17/2017, 9:34 AM

## 2017-09-17 NOTE — Progress Notes (Signed)
ED Antimicrobial Stewardship Positive Culture Follow Up   Sarah Liu is an 72 y.o. female who presented to [facility] on 09/13/2017 with a chief complaint of altered mental status.  UA was concerning for UTI and patient was discharged on Bactrim.  Chief Complaint  Patient presents with  . Hypoglycemia  . Altered Mental Status    Recent Results (from the past 720 hour(s))  Urine culture     Status: Abnormal   Collection Time: 09/13/17  7:45 PM  Result Value Ref Range Status   Specimen Description URINE, CLEAN CATCH  Final   Special Requests NONE  Final   Culture >=100,000 COLONIES/mL ENTEROCOCCUS FAECALIS (A)  Final   Report Status 09/16/2017 FINAL  Final   Organism ID, Bacteria ENTEROCOCCUS FAECALIS (A)  Final      Susceptibility   Enterococcus faecalis - MIC*    AMPICILLIN <=2 SENSITIVE Sensitive     LEVOFLOXACIN >=8 RESISTANT Resistant     NITROFURANTOIN <=16 SENSITIVE Sensitive     VANCOMYCIN 1 SENSITIVE Sensitive     * >=100,000 COLONIES/mL ENTEROCOCCUS FAECALIS    [x]  Treated with Bactrim, organism resistant to prescribed antimicrobial []  Patient discharged originally without antimicrobial agent and treatment is now indicated  New antibiotic prescription: Stop bactrim and start Macrobid 100 mg twice daily x 7 days   ED Provider: Delia Heady, PA-C   Albertina Parr, PharmD., BCPS Clinical Pharmacist Pager [phone]

## 2017-09-21 ENCOUNTER — Inpatient Hospital Stay: Admission: RE | Admit: 2017-09-21 | Payer: Medicare Other | Source: Ambulatory Visit

## 2017-10-02 ENCOUNTER — Other Ambulatory Visit: Payer: Self-pay

## 2017-10-02 ENCOUNTER — Inpatient Hospital Stay (HOSPITAL_COMMUNITY)
Admission: EM | Admit: 2017-10-02 | Discharge: 2017-10-07 | DRG: 682 | Disposition: A | Discharge status: Skilled Nursing Facility | Payer: Medicare Other | Attending: Internal Medicine | Admitting: Internal Medicine

## 2017-10-02 ENCOUNTER — Emergency Department (HOSPITAL_COMMUNITY): Payer: Medicare Other

## 2017-10-02 ENCOUNTER — Encounter (HOSPITAL_COMMUNITY): Payer: Self-pay

## 2017-10-02 DIAGNOSIS — I2510 Atherosclerotic heart disease of native coronary artery without angina pectoris: Secondary | ICD-10-CM | POA: Diagnosis present

## 2017-10-02 DIAGNOSIS — B9562 Methicillin resistant Staphylococcus aureus infection as the cause of diseases classified elsewhere: Secondary | ICD-10-CM | POA: Diagnosis present

## 2017-10-02 DIAGNOSIS — E11649 Type 2 diabetes mellitus with hypoglycemia without coma: Secondary | ICD-10-CM | POA: Diagnosis not present

## 2017-10-02 DIAGNOSIS — E43 Unspecified severe protein-calorie malnutrition: Secondary | ICD-10-CM

## 2017-10-02 DIAGNOSIS — N179 Acute kidney failure, unspecified: Principal | ICD-10-CM | POA: Diagnosis present

## 2017-10-02 DIAGNOSIS — Z794 Long term (current) use of insulin: Secondary | ICD-10-CM

## 2017-10-02 DIAGNOSIS — L89154 Pressure ulcer of sacral region, stage 4: Secondary | ICD-10-CM | POA: Diagnosis present

## 2017-10-02 DIAGNOSIS — N390 Urinary tract infection, site not specified: Secondary | ICD-10-CM

## 2017-10-02 DIAGNOSIS — I252 Old myocardial infarction: Secondary | ICD-10-CM

## 2017-10-02 DIAGNOSIS — E039 Hypothyroidism, unspecified: Secondary | ICD-10-CM | POA: Diagnosis present

## 2017-10-02 DIAGNOSIS — E860 Dehydration: Secondary | ICD-10-CM | POA: Diagnosis present

## 2017-10-02 DIAGNOSIS — Z89612 Acquired absence of left leg above knee: Secondary | ICD-10-CM

## 2017-10-02 DIAGNOSIS — I10 Essential (primary) hypertension: Secondary | ICD-10-CM | POA: Diagnosis present

## 2017-10-02 DIAGNOSIS — Z90710 Acquired absence of both cervix and uterus: Secondary | ICD-10-CM

## 2017-10-02 DIAGNOSIS — Z7982 Long term (current) use of aspirin: Secondary | ICD-10-CM

## 2017-10-02 DIAGNOSIS — Z23 Encounter for immunization: Secondary | ICD-10-CM

## 2017-10-02 DIAGNOSIS — Z7901 Long term (current) use of anticoagulants: Secondary | ICD-10-CM

## 2017-10-02 DIAGNOSIS — D509 Iron deficiency anemia, unspecified: Secondary | ICD-10-CM | POA: Diagnosis present

## 2017-10-02 DIAGNOSIS — B952 Enterococcus as the cause of diseases classified elsewhere: Secondary | ICD-10-CM | POA: Diagnosis present

## 2017-10-02 DIAGNOSIS — Z9049 Acquired absence of other specified parts of digestive tract: Secondary | ICD-10-CM

## 2017-10-02 DIAGNOSIS — E7800 Pure hypercholesterolemia, unspecified: Secondary | ICD-10-CM | POA: Diagnosis present

## 2017-10-02 DIAGNOSIS — Z681 Body mass index (BMI) 19 or less, adult: Secondary | ICD-10-CM

## 2017-10-02 DIAGNOSIS — K5900 Constipation, unspecified: Secondary | ICD-10-CM | POA: Diagnosis not present

## 2017-10-02 DIAGNOSIS — R627 Adult failure to thrive: Secondary | ICD-10-CM | POA: Diagnosis present

## 2017-10-02 DIAGNOSIS — I5032 Chronic diastolic (congestive) heart failure: Secondary | ICD-10-CM | POA: Diagnosis present

## 2017-10-02 DIAGNOSIS — Z87891 Personal history of nicotine dependence: Secondary | ICD-10-CM

## 2017-10-02 DIAGNOSIS — Z79899 Other long term (current) drug therapy: Secondary | ICD-10-CM

## 2017-10-02 DIAGNOSIS — Z86718 Personal history of other venous thrombosis and embolism: Secondary | ICD-10-CM

## 2017-10-02 DIAGNOSIS — Z8507 Personal history of malignant neoplasm of pancreas: Secondary | ICD-10-CM

## 2017-10-02 DIAGNOSIS — E1151 Type 2 diabetes mellitus with diabetic peripheral angiopathy without gangrene: Secondary | ICD-10-CM | POA: Diagnosis present

## 2017-10-02 DIAGNOSIS — L97419 Non-pressure chronic ulcer of right heel and midfoot with unspecified severity: Secondary | ICD-10-CM | POA: Diagnosis present

## 2017-10-02 DIAGNOSIS — L8990 Pressure ulcer of unspecified site, unspecified stage: Secondary | ICD-10-CM

## 2017-10-02 DIAGNOSIS — I110 Hypertensive heart disease with heart failure: Secondary | ICD-10-CM | POA: Diagnosis present

## 2017-10-02 DIAGNOSIS — E1129 Type 2 diabetes mellitus with other diabetic kidney complication: Secondary | ICD-10-CM

## 2017-10-02 DIAGNOSIS — I739 Peripheral vascular disease, unspecified: Secondary | ICD-10-CM | POA: Diagnosis present

## 2017-10-02 LAB — COMPREHENSIVE METABOLIC PANEL
ALBUMIN: 2 g/dL — AB (ref 3.5–5.0)
ALT: 31 U/L (ref 14–54)
ANION GAP: 9 (ref 5–15)
AST: 78 U/L — AB (ref 15–41)
Alkaline Phosphatase: 386 U/L — ABNORMAL HIGH (ref 38–126)
BILIRUBIN TOTAL: 0.3 mg/dL (ref 0.3–1.2)
BUN: 55 mg/dL — AB (ref 6–20)
CO2: 21 mmol/L — ABNORMAL LOW (ref 22–32)
Calcium: 9 mg/dL (ref 8.9–10.3)
Chloride: 105 mmol/L (ref 101–111)
Creatinine, Ser: 1.79 mg/dL — ABNORMAL HIGH (ref 0.44–1.00)
GFR calc Af Amer: 32 mL/min — ABNORMAL LOW (ref >60)
GFR calc non Af Amer: 28 mL/min — ABNORMAL LOW (ref >60)
GLUCOSE: 201 mg/dL — AB (ref 65–99)
POTASSIUM: 4.8 mmol/L (ref 3.5–5.1)
SODIUM: 135 mmol/L (ref 135–145)
TOTAL PROTEIN: 7.1 g/dL (ref 6.5–8.1)

## 2017-10-02 LAB — PROTIME-INR
INR: 1.24
PROTHROMBIN TIME: 15.5 s — AB (ref 11.4–15.2)

## 2017-10-02 LAB — CBC WITH DIFFERENTIAL/PLATELET
BASOS ABS: 0 10*3/uL (ref 0.0–0.1)
BASOS PCT: 0 %
EOS ABS: 0.1 10*3/uL (ref 0.0–0.7)
EOS PCT: 1 %
HCT: 39.3 % (ref 36.0–46.0)
Hemoglobin: 12.6 g/dL (ref 12.0–15.0)
Lymphocytes Relative: 17 %
Lymphs Abs: 1.4 10*3/uL (ref 0.7–4.0)
MCH: 29.6 pg (ref 26.0–34.0)
MCHC: 32.1 g/dL (ref 30.0–36.0)
MCV: 92.5 fL (ref 78.0–100.0)
MONO ABS: 0.3 10*3/uL (ref 0.1–1.0)
MONOS PCT: 3 %
NEUTROS ABS: 6.5 10*3/uL (ref 1.7–7.7)
Neutrophils Relative %: 79 %
Platelets: 606 10*3/uL — ABNORMAL HIGH (ref 150–400)
RBC: 4.25 MIL/uL (ref 3.87–5.11)
RDW: 19.3 % — AB (ref 11.5–15.5)
WBC: 8.3 10*3/uL (ref 4.0–10.5)

## 2017-10-02 LAB — URINALYSIS, ROUTINE W REFLEX MICROSCOPIC
Bilirubin Urine: NEGATIVE
Glucose, UA: NEGATIVE mg/dL
Ketones, ur: NEGATIVE mg/dL
Nitrite: NEGATIVE
PROTEIN: NEGATIVE mg/dL
SPECIFIC GRAVITY, URINE: 1.023 (ref 1.005–1.030)
pH: 5 (ref 5.0–8.0)

## 2017-10-02 LAB — I-STAT TROPONIN, ED: Troponin i, poc: 0.01 ng/mL (ref 0.00–0.08)

## 2017-10-02 MED ORDER — ONDANSETRON HCL 4 MG/2ML IJ SOLN: 4.0000 mg | Freq: Four times a day (QID) | INTRAMUSCULAR | Status: DC | PRN

## 2017-10-02 MED ORDER — DEXTROSE 5 % IV SOLN
1.0000 g | Freq: Once | INTRAVENOUS | Status: AC
  Administered 2017-10-02: 1 g via INTRAVENOUS
  Filled 2017-10-02: qty 10

## 2017-10-02 MED ORDER — MIRTAZAPINE 15 MG PO TABS
15.0000 mg | ORAL_TABLET | Freq: Every day | ORAL | Status: DC
  Administered 2017-10-03 – 2017-10-06 (×5): 15 mg via ORAL
  Filled 2017-10-02 (×5): qty 1

## 2017-10-02 MED ORDER — MEGESTROL ACETATE 400 MG/10ML PO SUSP
600.0000 mg | Freq: Every day | ORAL | Status: DC
  Administered 2017-10-03 – 2017-10-04 (×2): 600 mg via ORAL
  Filled 2017-10-02 (×2): qty 20

## 2017-10-02 MED ORDER — GABAPENTIN 100 MG PO CAPS
100.0000 mg | ORAL_CAPSULE | Freq: Three times a day (TID) | ORAL | Status: DC
  Administered 2017-10-03 – 2017-10-07 (×14): 100 mg via ORAL
  Filled 2017-10-02 (×15): qty 1

## 2017-10-02 MED ORDER — RISAQUAD PO CAPS
1.0000 | ORAL_CAPSULE | Freq: Every day | ORAL | Status: DC
  Administered 2017-10-03 – 2017-10-07 (×6): 1 via ORAL
  Filled 2017-10-02 (×6): qty 1

## 2017-10-02 MED ORDER — PANTOPRAZOLE SODIUM 40 MG PO TBEC
40.0000 mg | DELAYED_RELEASE_TABLET | Freq: Every day | ORAL | Status: DC
  Administered 2017-10-03 – 2017-10-07 (×6): 40 mg via ORAL
  Filled 2017-10-02 (×6): qty 1

## 2017-10-02 MED ORDER — ACETAMINOPHEN 325 MG PO TABS
650.0000 mg | ORAL_TABLET | Freq: Four times a day (QID) | ORAL | Status: DC | PRN
  Administered 2017-10-04: 650 mg via ORAL
  Filled 2017-10-02: qty 2

## 2017-10-02 MED ORDER — ATORVASTATIN CALCIUM 40 MG PO TABS
40.0000 mg | ORAL_TABLET | Freq: Every day | ORAL | Status: DC
  Administered 2017-10-03 – 2017-10-07 (×6): 40 mg via ORAL
  Filled 2017-10-02 (×6): qty 1

## 2017-10-02 MED ORDER — MODAFINIL 100 MG PO TABS: 100.0000 mg | ORAL_TABLET | Freq: Every day | ORAL | Status: DC

## 2017-10-02 MED ORDER — CARVEDILOL 6.25 MG PO TABS
6.2500 mg | ORAL_TABLET | Freq: Two times a day (BID) | ORAL | Status: DC
  Administered 2017-10-03 – 2017-10-07 (×7): 6.25 mg via ORAL
  Filled 2017-10-02 (×9): qty 1

## 2017-10-02 MED ORDER — INSULIN ASPART 100 UNIT/ML ~~LOC~~ SOLN
0.0000 [IU] | Freq: Three times a day (TID) | SUBCUTANEOUS | Status: DC
  Administered 2017-10-03 – 2017-10-04 (×2): 2 [IU] via SUBCUTANEOUS
  Administered 2017-10-05: 1 [IU] via SUBCUTANEOUS
  Administered 2017-10-05 – 2017-10-07 (×4): 2 [IU] via SUBCUTANEOUS

## 2017-10-02 MED ORDER — ASPIRIN EC 81 MG PO TBEC
81.0000 mg | DELAYED_RELEASE_TABLET | Freq: Every day | ORAL | Status: DC
  Administered 2017-10-03 – 2017-10-07 (×5): 81 mg via ORAL
  Filled 2017-10-02 (×5): qty 1

## 2017-10-02 MED ORDER — ACETAMINOPHEN 650 MG RE SUPP: 650.0000 mg | Freq: Four times a day (QID) | RECTAL | Status: DC | PRN

## 2017-10-02 MED ORDER — OXYCODONE HCL 5 MG PO TABS
5.0000 mg | ORAL_TABLET | ORAL | Status: DC | PRN
  Administered 2017-10-03 – 2017-10-07 (×6): 5 mg via ORAL
  Filled 2017-10-02 (×6): qty 1

## 2017-10-02 MED ORDER — SODIUM CHLORIDE 0.9 % IV BOLUS (SEPSIS)
1000.0000 mL | Freq: Once | INTRAVENOUS | Status: AC
  Administered 2017-10-02: 1000 mL via INTRAVENOUS

## 2017-10-02 MED ORDER — MAGNESIUM OXIDE 400 (241.3 MG) MG PO TABS
400.0000 mg | ORAL_TABLET | Freq: Two times a day (BID) | ORAL | Status: DC
  Administered 2017-10-03 – 2017-10-07 (×10): 400 mg via ORAL
  Filled 2017-10-02 (×10): qty 1

## 2017-10-02 MED ORDER — ONDANSETRON HCL 4 MG PO TABS: 4.0000 mg | ORAL_TABLET | Freq: Four times a day (QID) | ORAL | Status: DC | PRN

## 2017-10-02 MED ORDER — HYDRALAZINE HCL 20 MG/ML IJ SOLN: 10.0000 mg | INTRAMUSCULAR | Status: DC | PRN

## 2017-10-02 MED ORDER — POLYETHYLENE GLYCOL 3350 17 G PO PACK: 17.0000 g | PACK | Freq: Every day | ORAL | Status: DC | PRN

## 2017-10-02 MED ORDER — DEXTROSE 5 % IV SOLN
1.0000 g | INTRAVENOUS | Status: DC
  Administered 2017-10-04 (×2): 1 g via INTRAVENOUS
  Filled 2017-10-02 (×2): qty 10

## 2017-10-02 MED ORDER — POLYSACCHARIDE IRON COMPLEX 150 MG PO CAPS
150.0000 mg | ORAL_CAPSULE | Freq: Every day | ORAL | Status: DC
  Administered 2017-10-03 – 2017-10-07 (×6): 150 mg via ORAL
  Filled 2017-10-02 (×6): qty 1

## 2017-10-02 MED ORDER — FLUOXETINE HCL 20 MG PO CAPS
20.0000 mg | ORAL_CAPSULE | Freq: Every day | ORAL | Status: DC
  Administered 2017-10-03 – 2017-10-07 (×6): 20 mg via ORAL
  Filled 2017-10-02 (×6): qty 1

## 2017-10-02 MED ORDER — CALCITRIOL 0.5 MCG PO CAPS
0.5000 ug | ORAL_CAPSULE | Freq: Every day | ORAL | Status: DC
  Administered 2017-10-03 – 2017-10-07 (×5): 0.5 ug via ORAL
  Filled 2017-10-02 (×5): qty 1

## 2017-10-02 MED ORDER — SODIUM CHLORIDE 0.9 % IV SOLN
INTRAVENOUS | Status: AC
  Administered 2017-10-03: 01:00:00 via INTRAVENOUS

## 2017-10-02 MED ORDER — ENOXAPARIN SODIUM 30 MG/0.3ML ~~LOC~~ SOLN
30.0000 mg | Freq: Every day | SUBCUTANEOUS | Status: DC
  Administered 2017-10-03 – 2017-10-06 (×5): 30 mg via SUBCUTANEOUS
  Filled 2017-10-02 (×5): qty 0.3

## 2017-10-02 NOTE — ED Provider Notes (Signed)
[facility] DEPT Provider Note   CSN: [id number] Arrival date & time: 10/02/17  1615     History   Chief Complaint Chief Complaint  Patient presents with  . Failure To Thrive    HPI Sarah Liu is a 72 y.o. female.  HPI   72 year old female with history of pancreatic cancer, diabetes, CHF, DVT on warfarin, anemia brought here via EMS from [facility] with concerns of failure to thrive.  Per nursing note, patient has been refusing to eat or drink.  She has history of diabetes and left AKA.  Patient states she recently moved to [facility] approximately 2 months ago after she had a left AKA due to complication of diabetes.  Since being at the place, she does not enjoy the food and does not want to eat or drink.  She does like food that her family members brought however she have not been able to receive any food from them.  She currently does not have any specific complaint.  She denies feeling depressed.  No complaints of fever, chills, headache, trouble breathing, productive cough, nausea vomiting diarrhea, abdominal pain or back pain.  She does have a Foley catheter.  She stays mostly in bed, but does use wheelchair during therapy.  I did reached out to her facility and spoke with her nurse, however her nurse only work on the weekend and doesn't have much information to share.    Past Medical History:  Diagnosis Date  . Anemia 2012  . CHF (congestive heart failure) ([place])   . DVT (deep venous thrombosis) ([place]) 2011; 2012   left arm; left leg  . Heart attack ([place])    91, 92  . Heart murmur   . High cholesterol   . History of stomach ulcers    & jejunum9/2003  . Hypothyroidism 03/2001  . Pancreatic cancer ([place]) 08/2000  . Perforated ulcer ([place])   . Type I diabetes mellitus ([place]) 01/2001    Patient Active Problem List   Diagnosis Date Noted  . Hyperkalemia 11/06/2013  . AKI (acute kidney injury) ([place]) 11/06/2013  . Type  II or unspecified type diabetes mellitus with unspecified complication, uncontrolled 11/06/2013  . Unspecified hypothyroidism 11/06/2013    Past Surgical History:  Procedure Laterality Date  . CARDIAC CATHETERIZATION  1991; 1992  . CHOLECYSTECTOMY  1996  . PERIPHERAL ARTERIAL STENT GRAFT Right 09/2010   "leg"  . REPAIR OF PERFORATED ULCER  01/2000  . TONSILLECTOMY AND ADENOIDECTOMY  1960's  . VAGINAL HYSTERECTOMY  1994  . VASCULAR SURGERY    . WHIPPLE PROCEDURE  08/2000    OB History    No data available       Home Medications    Prior to Admission medications   Medication Sig Start Date End Date Taking? Authorizing Provider  acetaminophen (TYLENOL) 500 MG tablet Take 500 mg by mouth every 8 (eight) hours as needed.    [provider]  acidophilus (RISAQUAD) CAPS capsule Take 1 capsule by mouth daily.    [provider]  aspirin EC 81 MG tablet Take 81 mg by mouth daily.    [provider]  atorvastatin (LIPITOR) 40 MG tablet Take 40 mg by mouth daily.    [provider]  Benzocaine (HURRCAINE) 20 % AERO Use as directed 1 application in the mouth or throat.    [provider]  CALCITRIOL PO Take 0.5 mcg by mouth daily.     [provider]  carvedilol (COREG) 6.25 MG tablet Take 1 tablet (6.25 mg total) by mouth 2 (two) times daily with a meal. 11/08/13   Kelvin Cellar, MD  cephALEXin (KEFLEX) 500 MG capsule Take 1 capsule (500 mg total) by mouth 4 (four) times daily. Patient not taking: Reported on 09/13/2017 04/27/17   Deno Etienne, DO  ciprofloxacin (CIPRO) 500 MG tablet Take 500 mg by mouth 2 (two) times daily.    [provider]  doxycycline (DORYX) 100 MG EC tablet Take 100 mg by mouth 2 (two) times daily.    [provider]  FLUoxetine (PROZAC) 20 MG tablet Take 20 mg by mouth daily.  07/30/17   [provider]  furosemide (LASIX) 40 MG tablet Take 20 mg by mouth daily. 11/29/16   [provider]  gabapentin (NEURONTIN) 100 MG capsule Take 100 mg by mouth 3 (three) times daily. 07/29/17   [provider]  insulin glargine (LANTUS) 100 UNIT/ML injection Inject 0.3 mLs (30 Units total) into the skin at bedtime. Patient taking differently: Inject 10 Units into the skin at bedtime.  11/08/13   Kelvin Cellar, MD  insulin lispro (HUMALOG) 100 UNIT/ML injection Inject into the skin 3 (three) times daily before meals.    [provider]  IRON PO Take 1 tablet by mouth daily. 07/30/17   [provider]  iron polysaccharides (NIFEREX) 150 MG capsule Take 150 mg by mouth daily. 08/20/16   [provider]  losartan (COZAAR) 50 MG tablet Take 50 mg by mouth daily. 09/12/17   [provider]  magnesium oxide (MAG-OX) 400 MG tablet Take 400 mg by mouth 2 (two) times daily. 07/29/17   [provider]  meclizine (ANTIVERT) 25 MG tablet Take 25 mg by mouth daily as needed for dizziness.    [provider]  megestrol (MEGACE ES) 625 MG/5ML suspension Take 5 mLs (625 mg total) by mouth daily. 09/13/17   Ward, Ozella Almond, PA-C  metroNIDAZOLE (FLAGYL) 500 MG tablet 500 mg every other day.     [provider]  modafinil (PROVIGIL) 100 MG tablet Take 100 mg by mouth daily.    [provider]  Multiple Vitamins-Minerals (HAIR/SKIN/NAILS PO) Take 1 tablet by mouth daily.    [provider]  omeprazole (PRILOSEC) 20 MG capsule Take 20 mg by mouth daily.    [provider]  oxyCODONE (OXY IR/ROXICODONE) 5 MG immediate release tablet Take 5 mg by mouth every 4 (four) hours as needed.    [provider]  polyethylene glycol (MIRALAX / GLYCOLAX) packet Take 17 g by mouth daily as needed for mild constipation.  07/29/17   [provider]  senna (SENOKOT) 8.6 MG TABS tablet Take 2 tablets by mouth daily as needed for mild constipation.    [provider]  sodium bicarbonate 650 MG  tablet Take 2 tablets (1,300 mg total) by mouth 2 (two) times daily. 11/08/13   Kelvin Cellar, MD  traZODone (DESYREL) 100 MG tablet Take 100 mg by mouth at bedtime as needed for sleep.    [provider]  traZODone (DESYREL) 50 MG tablet Take 50 mg by mouth at bedtime.    [provider]  warfarin (COUMADIN) 5 MG tablet Take 2.5-5 mg by mouth daily. Take 1 tablet (61m) on Mondays and 1/2 tablet (2.5 mg) on all other days.    [provider]    Family History History reviewed. No pertinent family history.  Social History Social  History   Tobacco Use  . Smoking status: Former Smoker    Packs/day: 1.00    Years: 30.00    Pack years: 30.00    Types: Cigarettes  . Smokeless tobacco: Never Used  . Tobacco comment: 11/06/2013 "quit smoking cigarettes in 2011"  Substance Use Topics  . Alcohol use: Yes    Comment: 11/06/2013 "last drink > 5 yr ago; never had problem w/it"  . Drug use: No     Allergies   Latex and Penicillins   Review of Systems Review of Systems  Unable to perform ROS: Mental status change     Physical Exam Updated Vital Signs There were no vitals taken for this visit.  Physical Exam  Constitutional: She appears well-developed and well-nourished. No distress.  HENT:  Head: Normocephalic and atraumatic.  Mouth/Throat: Oropharynx is clear and moist.  Eyes: Conjunctivae and EOM are normal. Pupils are equal, round, and reactive to light.  Neck: Normal range of motion. Neck supple.  Cardiovascular: Normal rate and regular rhythm.  Pulmonary/Chest: Effort normal and breath sounds normal.  Abdominal: Soft. She exhibits no distension. There is no tenderness.  Musculoskeletal:  Left AKA with normal-appearing stump  Neurological: She is alert.  Patient is alert, able to move all 4 extremities with poor effort.  She is oriented x2, is taking month for January and February, year as 2018 instead of 2019  Skin: No rash noted.  Psychiatric:  She has a normal mood and affect.  Nursing note and vitals reviewed.    ED Treatments / Results  Labs (all labs ordered are listed, but only abnormal results are displayed) Labs Reviewed  CBC WITH DIFFERENTIAL/PLATELET - Abnormal; Notable for the following components:      Result Value   RDW 19.3 (*)    Platelets 606 (*)    All other components within normal limits  COMPREHENSIVE METABOLIC PANEL - Abnormal; Notable for the following components:   CO2 21 (*)    Glucose, Bld 201 (*)    BUN 55 (*)    Creatinine, Ser 1.79 (*)    Albumin 2.0 (*)    AST 78 (*)    Alkaline Phosphatase 386 (*)    GFR calc non Af Amer 28 (*)    GFR calc Af Amer 32 (*)    All other components within normal limits  URINALYSIS, ROUTINE W REFLEX MICROSCOPIC - Abnormal; Notable for the following components:   Color, Urine AMBER (*)    APPearance CLOUDY (*)    Hgb urine dipstick SMALL (*)    Leukocytes, UA LARGE (*)    Bacteria, UA MANY (*)    Squamous Epithelial / LPF 0-5 (*)    All other components within normal limits  PROTIME-INR - Abnormal; Notable for the following components:   Prothrombin Time 15.5 (*)    All other components within normal limits  URINE CULTURE  I-STAT TROPONIN, ED    EKG  EKG Interpretation  Date/Time:  Sunday October 02 2017 17:40:32 EST Ventricular Rate:  81 PR Interval:    QRS Duration: 88 QT Interval:  563 QTC Calculation: 558 R Axis:   -11 Text Interpretation:  Sinus rhythm Ventricular bigeminy Low voltage, extremity and precordial leads Abnormal R-wave progression, early transition Consider inferior infarct Prolonged QT interval Confirmed by Dene Gentry [phone]) on 10/02/2017 5:46:53 PM       Radiology Dg Chest 2 View  Result Date: 10/02/2017 CLINICAL DATA:  Failure to thrive.  Former smoker. EXAM: CHEST  2  VIEW COMPARISON:  07/02/2017 FINDINGS: Heart size and mediastinal contours are stable and within normal limits. A curvilinear density overlying the  periphery of the left lung likely reflects a skin fold artifact as there are lung markings beyond this. Pneumothorax is not entirely excluded but believed less likely. A repeat chest chest radiograph may help for further correlation. Subsegmental atelectasis and/or scarring is seen in the right mid lung. No acute osseous abnormality. IMPRESSION: Curvilinear density along the periphery of the left lung likely representing a skin fold artifact as lung markings are seen beyond this. Pneumothorax is believed less likely. This could be further correlated with a repeat chest radiograph with right-side-down decubitus views for confirmation. Minimal subpleural atelectasis and/or scarring in the right mid lung, new since prior. Electronically Signed   By: Ashley Royalty M.D.   On: 10/02/2017 17:21   US Abdomen Limited  Result Date: 10/02/2017 CLINICAL DATA:  Decreased appetite. Elevated alkaline phosphatase. Cholecystectomy. EXAM: ULTRASOUND ABDOMEN LIMITED RIGHT UPPER QUADRANT COMPARISON:  CT of the abdomen and pelvis on 07/03/2017 FINDINGS: Gallbladder: Cholecystectomy. Common bile duct: Diameter: 2.3 millimeters Liver: Mildly heterogeneous without focal liver lesion. Portal vein is patent on color Doppler imaging with normal direction of blood flow towards the liver. IMPRESSION: 1. Status post cholecystectomy. 2.  No evidence for acute  abnormality. Electronically Signed   By: Nolon Nations M.D.   On: 10/02/2017 20:54    Procedures Procedures (including critical care time)  Medications Ordered in ED Medications  cefTRIAXone (ROCEPHIN) 1 g in dextrose 5 % 50 mL IVPB (1 g Intravenous New Bag/Given 10/02/17 2034)  sodium chloride 0.9 % bolus 1,000 mL (0 mLs Intravenous Stopped 10/02/17 2012)     Initial Impression / Assessment and Plan / ED Course  I have reviewed the triage vital signs and the nursing notes.  Pertinent labs & imaging results that were available during my care of the patient were reviewed by  me and considered in my medical decision making (see chart for details).     BP (!) 97/48   Pulse 78   Resp 20   SpO2 98%    Final Clinical Impressions(s) / ED Diagnoses   Final diagnoses:  Failure to thrive in adult  AKI (acute kidney injury) ([place])  Lower urinary tract infectious disease    ED Discharge Orders    None     9:02 PM Patient having been eating and drinking at her facility.  States she does not want to eat or drink.  She is mildly hypotensive.  Labs remarkable for AK I with BUN 55, creatinine 1.79.  Elevated alk phos at 386 with a unremarkable abdominal limited ultrasound.  Urine consistent with a urinary tract infection, will give Rocephin.  She is subtherapeutic on her INR.  Chest x-ray without signs of pneumonia.  There is a curvilinear density along the periphery of the left lung likely representing a skinfold artifact.  Pneumothorax is less likely.  I have low suspicion for pneumothorax as patient denies having any shortness of breath.  Patient getting IV fluid and antibiotic.  Appreciate consultation from triad hospitalist Dr. Hal Hope who agrees to see and admit pt for further management.    Domenic Moras, PA-C 10/02/17 2105    Valarie Merino, MD 10/02/17 [phone]

## 2017-10-02 NOTE — ED Triage Notes (Signed)
EMS reports from [facility], nursing staff reports failure to thrive, Pt refusing to eat or drink. Hx of diabetes.Left AKA  BP 106/48 HR 74 Resp 18 CBG 312

## 2017-10-02 NOTE — H&P (Addendum)
History and Physical    Sarah Liu DVV:[id number] DOB: [birth year] DOA: 10/02/2017  PCP: Lilian Coma., MD  Patient coming from: Skilled nursing facility.  Chief Complaint: Poor appetite.  Weakness.  HPI: Sarah Liu is a 72 y.o. female with history of diabetes mellitus type 2, peripheral vascular disease status post left AKA, sacral decubitus and heel ulcers, CHF, DVT on Coumadin, CAD, hypothyroidism, history of pancreatic cancer was referred to the ER patient was found to have poor appetite and generally weak.  Patient states she has been having poor appetite and is not liking the food at the skilled nursing facility.  Denies any nausea vomiting or abdominal pain.  ED Course: In the ER labs revealed creatinine of 1.7 which has worsened from recent 0.7.  UA shows features concerning for UTI.  Patient looks generally debilitated.  There is a stage IV sacral diabetes also and right heel ulcer.  Patient was started on fluid ceftriaxone and admitted for further observation.  Review of Systems: As per HPI, rest all negative.   Past Medical History:  Diagnosis Date  . Anemia 2012  . CHF (congestive heart failure) ([place])   . DVT (deep venous thrombosis) ([place]) 2011; 2012   left arm; left leg  . Heart attack ([place])    91, 92  . Heart murmur   . High cholesterol   . History of stomach ulcers    & jejunum9/2003  . Hypothyroidism 03/2001  . Pancreatic cancer ([place]) 08/2000  . Perforated ulcer ([place])   . Type I diabetes mellitus ([place]) 01/2001    Past Surgical History:  Procedure Laterality Date  . CARDIAC CATHETERIZATION  1991; 1992  . CHOLECYSTECTOMY  1996  . PERIPHERAL ARTERIAL STENT GRAFT Right 09/2010   "leg"  . REPAIR OF PERFORATED ULCER  01/2000  . TONSILLECTOMY AND ADENOIDECTOMY  1960's  . VAGINAL HYSTERECTOMY  1994  . VASCULAR SURGERY    . WHIPPLE PROCEDURE  08/2000     reports that she has quit smoking. Her smoking use included cigarettes. She has a 30.00 pack-year  smoking history. she has never used smokeless tobacco. She reports that she drinks alcohol. She reports that she does not use drugs.  Allergies  Allergen Reactions  . Latex Hives  . Penicillins Hives    Family History  Problem Relation Age of Onset  . Hypertension Other     Prior to Admission medications   Medication Sig Start Date End Date Taking? Authorizing Provider  acetaminophen (TYLENOL) 500 MG tablet Take 500 mg by mouth 3 (three) times daily.    Yes [provider]  acidophilus (RISAQUAD) CAPS capsule Take 1 capsule by mouth daily.   Yes [provider]  aspirin EC 81 MG tablet Take 81 mg by mouth daily.   Yes [provider]  atorvastatin (LIPITOR) 40 MG tablet Take 40 mg by mouth daily.   Yes [provider]  Benzocaine (HURRCAINE) 20 % AERO Use as directed 1 application in the mouth or throat.   Yes [provider]  CALCITRIOL PO Take 0.5 mcg by mouth daily.    Yes [provider]  carvedilol (COREG) 6.25 MG tablet Take 1 tablet (6.25 mg total) by mouth 2 (two) times daily with a meal. 11/08/13  Yes Kelvin Cellar, MD  ciprofloxacin (CIPRO) 500 MG tablet Take 500 mg by mouth 2 (two) times daily.   Yes [provider]  FLUoxetine (PROZAC) 20 MG tablet Take 20 mg by mouth daily.  07/30/17  Yes [provider]  furosemide (LASIX) 40 MG tablet Take 20 mg by mouth daily. 11/29/16  Yes [provider]  gabapentin (NEURONTIN) 100 MG capsule Take 100 mg by mouth 3 (three) times daily. 07/29/17  Yes [provider]  insulin glargine (LANTUS) 100 UNIT/ML injection Inject 0.3 mLs (30 Units total) into the skin at bedtime. Patient taking differently: Inject 10 Units into the skin at bedtime.  11/08/13  Yes Kelvin Cellar, MD  insulin lispro (HUMALOG) 100 UNIT/ML injection Inject 2-6 Units into the skin 3 (three) times daily before meals.    Yes [provider]  iron polysaccharides (NIFEREX)  150 MG capsule Take 150 mg by mouth daily. 08/20/16  Yes [provider]  losartan (COZAAR) 50 MG tablet Take 50 mg by mouth daily. 09/12/17  Yes [provider]  magnesium oxide (MAG-OX) 400 MG tablet Take 400 mg by mouth 2 (two) times daily. 07/29/17  Yes [provider]  megestrol (MEGACE ES) 625 MG/5ML suspension Take 5 mLs (625 mg total) by mouth daily. 09/13/17  Yes Ward, Ozella Almond, PA-C  metroNIDAZOLE (FLAGYL) 500 MG tablet 500 mg every other day.    Yes [provider]  mirtazapine (REMERON) 15 MG tablet Take 15 mg by mouth at bedtime. 09/30/17  Yes [provider]  Multiple Vitamins-Minerals (MEGA MULTIVITAMIN PO) Take 1 tablet by mouth daily.   Yes [provider]  omeprazole (PRILOSEC) 20 MG capsule Take 20 mg by mouth daily.   Yes [provider]  oxyCODONE (OXY IR/ROXICODONE) 5 MG immediate release tablet Take 5 mg by mouth every 4 (four) hours as needed.   Yes [provider]  polyethylene glycol (MIRALAX / GLYCOLAX) packet Take 17 g by mouth daily as needed for mild constipation.  07/29/17  Yes [provider]  senna (SENOKOT) 8.6 MG TABS tablet Take 2 tablets by mouth daily as needed for mild constipation.   Yes [provider]  sodium bicarbonate 650 MG tablet Take 2 tablets (1,300 mg total) by mouth 2 (two) times daily. 11/08/13  Yes Kelvin Cellar, MD  doxycycline (DORYX) 100 MG EC tablet Take 100 mg by mouth 2 (two) times daily.    [provider]  IRON PO Take 1 tablet by mouth daily. 07/30/17   [provider]  meclizine (ANTIVERT) 25 MG tablet Take 25 mg by mouth daily as needed for dizziness.    [provider]  modafinil (PROVIGIL) 100 MG tablet Take 100 mg by mouth daily.    [provider]  traZODone (DESYREL) 100 MG tablet Take 100 mg by mouth at bedtime as needed for sleep.    [provider]  traZODone (DESYREL) 50 MG tablet Take 50 mg  by mouth at bedtime.    [provider]  warfarin (COUMADIN) 5 MG tablet Take 2.5-5 mg by mouth daily. Take 1 tablet (5mg ) on Mondays and 1/2 tablet (2.5 mg) on all other days.    [provider]    Physical Exam: Vitals:   10/02/17 1915 10/02/17 1930 10/02/17 1945 10/02/17 2000  BP:    (!) 97/48  Pulse:      Resp: 12 (!) 24 (!) 21 20  SpO2:          Constitutional: Moderately built and poorly nourished. Vitals:   10/02/17 1915 10/02/17 1930 10/02/17 1945 10/02/17 2000  BP:    (!) 97/48  Pulse:      Resp: 12 (!) 24 (!) 21 20  SpO2:       Eyes: Anicteric no pallor. ENMT: No discharge from the ears eyes nose or mouth. Neck: No mass palpated no JVD appreciated. Respiratory: No rhonchi or crepitations. Cardiovascular: S1-S2 heard no murmurs appreciated. Abdomen: Soft nontender bowel sounds present. Musculoskeletal: Left AKA and right heel ulcer. Skin: Stage IV sacral decubitus ulcer with no discharge and right heel ulcer. Neurologic: Alert awake oriented to time place and person moves all extremities. Psychiatric: Denies any suicidal ideation not sure if patient is depressed.   Labs on Admission: I have personally reviewed following labs and imaging studies  CBC: Recent Labs  Lab 10/02/17 1725  WBC 8.3  NEUTROABS 6.5  HGB 12.6  HCT 39.3  MCV 92.5  PLT 892*   Basic Metabolic Panel: Recent Labs  Lab 10/02/17 1725  NA 135  K 4.8  CL 105  CO2 21*  GLUCOSE 201*  BUN 55*  CREATININE 1.79*  CALCIUM 9.0   GFR: CrCl cannot be calculated (Unknown ideal weight.). Liver Function Tests: Recent Labs  Lab 10/02/17 1725  AST 78*  ALT 31  ALKPHOS 386*  BILITOT 0.3  PROT 7.1  ALBUMIN 2.0*   No results for input(s): LIPASE, AMYLASE in the last 168 hours. No results for input(s): AMMONIA in the last 168 hours. Coagulation Profile: Recent Labs  Lab 10/02/17 1725  INR 1.24   Cardiac Enzymes: No results for input(s): CKTOTAL, CKMB, CKMBINDEX,  TROPONINI in the last 168 hours. BNP (last 3 results) No results for input(s): PROBNP in the last 8760 hours. HbA1C: No results for input(s): HGBA1C in the last 72 hours. CBG: No results for input(s): GLUCAP in the last 168 hours. Lipid Profile: No results for input(s): CHOL, HDL, LDLCALC, TRIG, CHOLHDL, LDLDIRECT in the last 72 hours. Thyroid Function Tests: No results for input(s): TSH, T4TOTAL, FREET4, T3FREE, THYROIDAB in the last 72 hours. Anemia Panel: No results for input(s): VITAMINB12, FOLATE, FERRITIN, TIBC, IRON, RETICCTPCT in the last 72 hours. Urine analysis:    Component Value Date/Time   COLORURINE AMBER (A) 10/02/2017 1649   APPEARANCEUR CLOUDY (A) 10/02/2017 1649   LABSPEC 1.023 10/02/2017 1649   PHURINE 5.0 10/02/2017 1649   GLUCOSEU NEGATIVE 10/02/2017 1649   HGBUR SMALL (A) 10/02/2017 1649   BILIRUBINUR NEGATIVE 10/02/2017 1649   KETONESUR NEGATIVE 10/02/2017 1649   PROTEINUR NEGATIVE 10/02/2017 1649   UROBILINOGEN 0.2 11/06/2013 1426   NITRITE NEGATIVE 10/02/2017 1649   LEUKOCYTESUR LARGE (A) 10/02/2017 1649   Sepsis Labs: @LABRCNTIP (procalcitonin:4,lacticidven:4) )No results found for this or any previous visit (from the past 240 hour(s)).   Radiological Exams on Admission: Dg Chest 2 View  Result Date: 10/02/2017 CLINICAL DATA:  Failure to thrive.  Former smoker. EXAM: CHEST  2 VIEW COMPARISON:  07/02/2017 FINDINGS: Heart size and mediastinal contours are stable and within normal limits. A curvilinear density overlying the periphery of the left lung likely reflects a skin fold artifact as there are lung markings beyond this. Pneumothorax is not entirely excluded but believed less likely. A repeat chest chest radiograph may help for further correlation. Subsegmental atelectasis and/or scarring is seen in the right mid lung. No acute osseous abnormality. IMPRESSION: Curvilinear density along the periphery of the left lung likely representing a skin fold  artifact as lung markings are seen beyond this. Pneumothorax is believed less likely. This could be further correlated with a repeat chest radiograph with right-side-down decubitus views for confirmation. Minimal subpleural atelectasis and/or scarring in the right mid lung, new since prior.  Electronically Signed   By: Ashley Royalty M.D.   On: 10/02/2017 17:21   US Abdomen Limited  Result Date: 10/02/2017 CLINICAL DATA:  Decreased appetite. Elevated alkaline phosphatase. Cholecystectomy. EXAM: ULTRASOUND ABDOMEN LIMITED RIGHT UPPER QUADRANT COMPARISON:  CT of the abdomen and pelvis on 07/03/2017 FINDINGS: Gallbladder: Cholecystectomy. Common bile duct: Diameter: 2.3 millimeters Liver: Mildly heterogeneous without focal liver lesion. Portal vein is patent on color Doppler imaging with normal direction of blood flow towards the liver. IMPRESSION: 1. Status post cholecystectomy. 2.  No evidence for acute  abnormality. Electronically Signed   By: Nolon Nations M.D.   On: 10/02/2017 20:54    EKG: Independently reviewed.  Normal sinus rhythm low voltage.  Assessment/Plan Principal Problem:   ARF (acute renal failure) (HCC) Active Problems:   DM (diabetes mellitus), type 2 with renal complications (HCC)   PVD (peripheral vascular disease) (HCC)   CAD (coronary artery disease)   Essential hypertension    1. Acute renal failure with dehydration and poor appetite -patient is placed on gentle hydration hold Cozaar Lasix for now.  Follow metabolic panel intake output. 2. Poor appetite -not sure of the hospital once creatinine improves may have to get a CT abdomen and pelvis since patient as per the chart has history of pancreatic cancer previously.  Will get palliative care consult.  Patient is on Megace.  We need to get psychiatric involvement. 3. UTI on ceftriaxone follow urine cultures. 4. History of CAD denies any chest pain.  On Coreg statins and Coumadin. 5. Diabetes mellitus type 2 on Lantus  insulin.  Since patient's oral intake is not clear will keep patient on sliding scale and closely follow CBGs.  If patient is able to eat then may restart long-acting insulin.  Closely follow metabolic panel to make sure that patient is not developing an acidosis. 6. Peripheral vascular disease status post left AKA. 7. History of iron deficiency anemia on iron supplements. 8. History of CHF presently holding Lasix and ARB due to dehydration and renal failure. 9. Hypertension holding ARB due to renal failure and place patient on PRN IV hydralazine. 10. Sacral decubitus and heel ulcer wound team consulted.  Patient seems to be recently on antibiotics.  But wounds do not look infected. 11. Severe protein calorie malnutrition will obtain dietary consult.  Since patient's chest x-ray showing some abnormality without contrast.  We will need to get further detailed history and from family when available.  This with pharmacist and it is found that patient is presently not on Coumadin as per the records from the nursing home.  The last time patient was on Coumadin was around 3 years ago.  I have also reviewed care everywhere and during recent admissions in other hospitals patient was not on Coumadin.  DVT prophylaxis: Lovenox. Code Status: Full code. Family Communication: No family at the bedside. Disposition Plan: To be determined. Consults called: Palliative care. Admission status: Inpatient.   Rise Patience MD [facility] Pager [phone].  If 7PM-7AM, please contact night-coverage [email or web address] Password [facility]  10/02/2017, 9:41 PM

## 2017-10-02 NOTE — Progress Notes (Signed)
PHARMACY NOTE -  ANTIBIOTIC RENAL DOSE ADJUSTMENT   Request received for Pharmacy to assist with antibiotic renal dose adjustment.  Patient has been initiated on Ceftriaxone 1gm iv q24hr  for UTI. SCr 1.79, estimated CrCl 33N ml/min Current dosage is appropriate and need for further dosage adjustment appears unlikely at present. Will sign off at this time.  Please reconsult if a change in clinical status warrants re-evaluation of dosage.

## 2017-10-02 NOTE — Progress Notes (Signed)
Lovenox per Pharmacy for DVT Prophylaxis    Pharmacy has been consulted from dosing enoxaparin (lovenox) in this patient for DVT prophylaxis.  The pharmacist has reviewed pertinent labs (Hgb _12.6__; PLT_606__), patient weight (_44__kg) and renal function (CrCl_21__mL/min) and decided that enoxaparin _30_mg SQ Q24Hrs is appropriate for this patient.  The pharmacy department will sign off at this time.  Please reconsult pharmacy if status changes or for further issues.  Thank you  Cyndia Diver PharmD, BCPS  10/02/2017, 11:02 PM

## 2017-10-02 NOTE — ED Notes (Signed)
ED TO INPATIENT HANDOFF REPORT  Name/Age/Gender Sarah Liu 72 y.o. female  Code Status Code Status History    Date Active Date Inactive Code Status Order ID Comments User Context   11/06/2013 17:28 11/08/2013 19:05 Full Code [id number]  Geradine Girt, DO Inpatient      Home/SNF/Other Rehab  Chief Complaint failure to thrive  Level of Care/Admitting Diagnosis ED Disposition    ED Disposition Condition [place]: [facility] [100102]  Level of Care: Telemetry [5]  Admit to tele based on following criteria: Monitor for Ischemic changes  Diagnosis: ARF (acute renal failure) [facility]) [185631]  Admitting Physician: Rise Patience [id number]  Attending Physician: Rise Patience [email or web address]  PT Class (Do Not Modify): Observation [104]  PT Acc Code (Do Not Modify): Observation [10022]       Medical History Past Medical History:  Diagnosis Date  . Anemia 2012  . CHF (congestive heart failure) ([place])   . DVT (deep venous thrombosis) ([place]) 2011; 2012   left arm; left leg  . Heart attack ([place])    91, 92  . Heart murmur   . High cholesterol   . History of stomach ulcers    & jejunum9/2003  . Hypothyroidism 03/2001  . Pancreatic cancer ([place]) 08/2000  . Perforated ulcer ([place])   . Type I diabetes mellitus ([place]) 01/2001    Allergies Allergies  Allergen Reactions  . Latex Hives  . Penicillins Hives    IV Location/Drains/Wounds Patient Lines/Drains/Airways Status   Active Line/Drains/Airways    Name:   Placement date:   Placement time:   Site:   Days:   Peripheral IV 10/02/17 Right Antecubital   10/02/17    1723    Antecubital   less than 1   Urethral Catheter Kaitlin, RN Latex 14 Fr.   09/13/17    2247    Latex   19   Wound / Incision (Open or Dehisced) 11/06/13 Diabetic ulcer Toe (Comment  which one) Right black but not drainage   11/06/13    1600    Toe (Comment  which one)   1426          Labs/Imaging Results for  orders placed or performed during the hospital encounter of 10/02/17 (from the past 48 hour(s))  Urinalysis, Routine w reflex microscopic     Status: Abnormal   Collection Time: 10/02/17  4:49 PM  Result Value Ref Range   Color, Urine AMBER (A) YELLOW    Comment: BIOCHEMICALS MAY BE AFFECTED BY COLOR   APPearance CLOUDY (A) CLEAR   Specific Gravity, Urine 1.023 1.005 - 1.030   pH 5.0 5.0 - 8.0   Glucose, UA NEGATIVE NEGATIVE mg/dL   Hgb urine dipstick SMALL (A) NEGATIVE   Bilirubin Urine NEGATIVE NEGATIVE   Ketones, ur NEGATIVE NEGATIVE mg/dL   Protein, ur NEGATIVE NEGATIVE mg/dL   Nitrite NEGATIVE NEGATIVE   Leukocytes, UA LARGE (A) NEGATIVE   RBC / HPF 0-5 0 - 5 RBC/hpf   WBC, UA TOO NUMEROUS TO COUNT 0 - 5 WBC/hpf   Bacteria, UA MANY (A) NONE SEEN   Squamous Epithelial / LPF 0-5 (A) NONE SEEN   WBC Clumps PRESENT    Hyaline Casts, UA PRESENT     Comment: Performed at [facility], [place] [address] [place], [place] [address]  CBC with Differential/Platelet     Status: Abnormal   Collection Time: 10/02/17  5:25 PM  Result Value Ref  Range   WBC 8.3 4.0 - 10.5 K/uL   RBC 4.25 3.87 - 5.11 MIL/uL   Hemoglobin 12.6 12.0 - 15.0 g/dL   HCT 39.3 36.0 - 46.0 %   MCV 92.5 78.0 - 100.0 fL   MCH 29.6 26.0 - 34.0 pg   MCHC 32.1 30.0 - 36.0 g/dL   RDW 19.3 (H) 11.5 - 15.5 %   Platelets 606 (H) 150 - 400 K/uL   Neutrophils Relative % 79 %   Neutro Abs 6.5 1.7 - 7.7 K/uL   Lymphocytes Relative 17 %   Lymphs Abs 1.4 0.7 - 4.0 K/uL   Monocytes Relative 3 %   Monocytes Absolute 0.3 0.1 - 1.0 K/uL   Eosinophils Relative 1 %   Eosinophils Absolute 0.1 0.0 - 0.7 K/uL   Basophils Relative 0 %   Basophils Absolute 0.0 0.0 - 0.1 K/uL    Comment: Performed at [facility], [place] [address], [place], [place] [address]  Comprehensive metabolic panel     Status: Abnormal   Collection Time: 10/02/17  5:25 PM  Result Value Ref Range   Sodium 135 135 - 145 mmol/L    Potassium 4.8 3.5 - 5.1 mmol/L   Chloride 105 101 - 111 mmol/L   CO2 21 (L) 22 - 32 mmol/L   Glucose, Bld 201 (H) 65 - 99 mg/dL   BUN 55 (H) 6 - 20 mg/dL   Creatinine, Ser 1.79 (H) 0.44 - 1.00 mg/dL   Calcium 9.0 8.9 - 10.3 mg/dL   Total Protein 7.1 6.5 - 8.1 g/dL   Albumin 2.0 (L) 3.5 - 5.0 g/dL   AST 78 (H) 15 - 41 U/L   ALT 31 14 - 54 U/L   Alkaline Phosphatase 386 (H) 38 - 126 U/L   Total Bilirubin 0.3 0.3 - 1.2 mg/dL   GFR calc non Af Amer 28 (L) >60 mL/min   GFR calc Af Amer 32 (L) >60 mL/min    Comment: (NOTE) The eGFR has been calculated using the CKD EPI equation. This calculation has not been validated in all clinical situations. eGFR's persistently <60 mL/min signify possible Chronic Kidney Disease.    Anion gap 9 5 - 15    Comment: Performed at [facility], [place] [address], [place], [place] [address]  Protime-INR     Status: Abnormal   Collection Time: 10/02/17  5:25 PM  Result Value Ref Range   Prothrombin Time 15.5 (H) 11.4 - 15.2 seconds   INR 1.24     Comment: Performed at [facility], [place] [address] [place], [place] [address]  I-stat troponin, ED     Status: None   Collection Time: 10/02/17  5:32 PM  Result Value Ref Range   Troponin i, poc 0.01 0.00 - 0.08 ng/mL   Comment 3            Comment: Due to the release kinetics of cTnI, a negative result within the first hours of the onset of symptoms does not rule out myocardial infarction with certainty. If myocardial infarction is still suspected, repeat the test at appropriate intervals.    Dg Chest 2 View  Result Date: 10/02/2017 CLINICAL DATA:  Failure to thrive.  Former smoker. EXAM: CHEST  2 VIEW COMPARISON:  07/02/2017 FINDINGS: Heart size and mediastinal contours are stable and within normal limits. A curvilinear density overlying the periphery of the left lung likely reflects a skin fold artifact as there are lung markings beyond  this. Pneumothorax is not  entirely excluded but believed less likely. A repeat chest chest radiograph may help for further correlation. Subsegmental atelectasis and/or scarring is seen in the right mid lung. No acute osseous abnormality. IMPRESSION: Curvilinear density along the periphery of the left lung likely representing a skin fold artifact as lung markings are seen beyond this. Pneumothorax is believed less likely. This could be further correlated with a repeat chest radiograph with right-side-down decubitus views for confirmation. Minimal subpleural atelectasis and/or scarring in the right mid lung, new since prior. Electronically Signed   By: Ashley Royalty M.D.   On: 10/02/2017 17:21   US Abdomen Limited  Result Date: 10/02/2017 CLINICAL DATA:  Decreased appetite. Elevated alkaline phosphatase. Cholecystectomy. EXAM: ULTRASOUND ABDOMEN LIMITED RIGHT UPPER QUADRANT COMPARISON:  CT of the abdomen and pelvis on 07/03/2017 FINDINGS: Gallbladder: Cholecystectomy. Common bile duct: Diameter: 2.3 millimeters Liver: Mildly heterogeneous without focal liver lesion. Portal vein is patent on color Doppler imaging with normal direction of blood flow towards the liver. IMPRESSION: 1. Status post cholecystectomy. 2.  No evidence for acute  abnormality. Electronically Signed   By: Nolon Nations M.D.   On: 10/02/2017 20:54    Pending Labs Unresulted Labs (From admission, onward)   Start     Ordered   10/02/17 2009  Urine Culture  STAT,   STAT     10/02/17 2010      Vitals/Pain Today's Vitals   10/02/17 1915 10/02/17 1930 10/02/17 1945 10/02/17 2000  BP:    (!) 97/48  Pulse:      Resp: 12 (!) 24 (!) 21 20  SpO2:      PainSc:        Isolation Precautions No active isolations  Medications Medications  sodium chloride 0.9 % bolus 1,000 mL (0 mLs Intravenous Stopped 10/02/17 2012)  cefTRIAXone (ROCEPHIN) 1 g in dextrose 5 % 50 mL IVPB (0 g Intravenous Stopped 10/02/17 2104)    Mobility non-ambulatory

## 2017-10-02 NOTE — ED Notes (Signed)
Please keep family member Dalphine Handing informed at [phone], for pt admission or discharge.

## 2017-10-03 ENCOUNTER — Encounter (HOSPITAL_COMMUNITY): Payer: Self-pay

## 2017-10-03 ENCOUNTER — Observation Stay (HOSPITAL_COMMUNITY): Payer: Medicare Other

## 2017-10-03 ENCOUNTER — Other Ambulatory Visit: Payer: Self-pay

## 2017-10-03 DIAGNOSIS — N179 Acute kidney failure, unspecified: Secondary | ICD-10-CM | POA: Diagnosis present

## 2017-10-03 DIAGNOSIS — Z86718 Personal history of other venous thrombosis and embolism: Secondary | ICD-10-CM | POA: Diagnosis not present

## 2017-10-03 DIAGNOSIS — E039 Hypothyroidism, unspecified: Secondary | ICD-10-CM | POA: Diagnosis present

## 2017-10-03 DIAGNOSIS — Z87891 Personal history of nicotine dependence: Secondary | ICD-10-CM | POA: Diagnosis not present

## 2017-10-03 DIAGNOSIS — L89300 Pressure ulcer of unspecified buttock, unstageable: Secondary | ICD-10-CM | POA: Diagnosis not present

## 2017-10-03 DIAGNOSIS — Z794 Long term (current) use of insulin: Secondary | ICD-10-CM | POA: Diagnosis not present

## 2017-10-03 DIAGNOSIS — I739 Peripheral vascular disease, unspecified: Secondary | ICD-10-CM | POA: Diagnosis not present

## 2017-10-03 DIAGNOSIS — E7800 Pure hypercholesterolemia, unspecified: Secondary | ICD-10-CM | POA: Diagnosis present

## 2017-10-03 DIAGNOSIS — L97419 Non-pressure chronic ulcer of right heel and midfoot with unspecified severity: Secondary | ICD-10-CM | POA: Diagnosis present

## 2017-10-03 DIAGNOSIS — R627 Adult failure to thrive: Secondary | ICD-10-CM | POA: Diagnosis not present

## 2017-10-03 DIAGNOSIS — N390 Urinary tract infection, site not specified: Secondary | ICD-10-CM | POA: Diagnosis present

## 2017-10-03 DIAGNOSIS — Z681 Body mass index (BMI) 19 or less, adult: Secondary | ICD-10-CM | POA: Diagnosis not present

## 2017-10-03 DIAGNOSIS — I2510 Atherosclerotic heart disease of native coronary artery without angina pectoris: Secondary | ICD-10-CM | POA: Diagnosis present

## 2017-10-03 DIAGNOSIS — Z79899 Other long term (current) drug therapy: Secondary | ICD-10-CM | POA: Diagnosis not present

## 2017-10-03 DIAGNOSIS — I10 Essential (primary) hypertension: Secondary | ICD-10-CM | POA: Diagnosis not present

## 2017-10-03 DIAGNOSIS — I252 Old myocardial infarction: Secondary | ICD-10-CM | POA: Diagnosis not present

## 2017-10-03 DIAGNOSIS — Z23 Encounter for immunization: Secondary | ICD-10-CM | POA: Diagnosis present

## 2017-10-03 DIAGNOSIS — L89154 Pressure ulcer of sacral region, stage 4: Secondary | ICD-10-CM | POA: Diagnosis present

## 2017-10-03 DIAGNOSIS — B952 Enterococcus as the cause of diseases classified elsewhere: Secondary | ICD-10-CM | POA: Diagnosis present

## 2017-10-03 DIAGNOSIS — Z9049 Acquired absence of other specified parts of digestive tract: Secondary | ICD-10-CM | POA: Diagnosis not present

## 2017-10-03 DIAGNOSIS — E1151 Type 2 diabetes mellitus with diabetic peripheral angiopathy without gangrene: Secondary | ICD-10-CM | POA: Diagnosis present

## 2017-10-03 DIAGNOSIS — Z8507 Personal history of malignant neoplasm of pancreas: Secondary | ICD-10-CM | POA: Diagnosis not present

## 2017-10-03 DIAGNOSIS — I5032 Chronic diastolic (congestive) heart failure: Secondary | ICD-10-CM | POA: Diagnosis present

## 2017-10-03 DIAGNOSIS — E43 Unspecified severe protein-calorie malnutrition: Secondary | ICD-10-CM | POA: Diagnosis present

## 2017-10-03 DIAGNOSIS — Z89612 Acquired absence of left leg above knee: Secondary | ICD-10-CM | POA: Diagnosis not present

## 2017-10-03 DIAGNOSIS — I110 Hypertensive heart disease with heart failure: Secondary | ICD-10-CM | POA: Diagnosis present

## 2017-10-03 DIAGNOSIS — B9562 Methicillin resistant Staphylococcus aureus infection as the cause of diseases classified elsewhere: Secondary | ICD-10-CM | POA: Diagnosis present

## 2017-10-03 DIAGNOSIS — Z90710 Acquired absence of both cervix and uterus: Secondary | ICD-10-CM | POA: Diagnosis not present

## 2017-10-03 DIAGNOSIS — E1129 Type 2 diabetes mellitus with other diabetic kidney complication: Secondary | ICD-10-CM | POA: Diagnosis not present

## 2017-10-03 LAB — BASIC METABOLIC PANEL
Anion gap: 10 (ref 5–15)
BUN: 48 mg/dL — ABNORMAL HIGH (ref 6–20)
CHLORIDE: 108 mmol/L (ref 101–111)
CO2: 19 mmol/L — ABNORMAL LOW (ref 22–32)
CREATININE: 1.41 mg/dL — AB (ref 0.44–1.00)
Calcium: 8.4 mg/dL — ABNORMAL LOW (ref 8.9–10.3)
GFR calc Af Amer: 43 mL/min — ABNORMAL LOW (ref >60)
GFR calc non Af Amer: 37 mL/min — ABNORMAL LOW (ref >60)
GLUCOSE: 120 mg/dL — AB (ref 65–99)
Potassium: 4.8 mmol/L (ref 3.5–5.1)
Sodium: 137 mmol/L (ref 135–145)

## 2017-10-03 LAB — GLUCOSE, CAPILLARY
GLUCOSE-CAPILLARY: 112 mg/dL — AB (ref 65–99)
Glucose-Capillary: 154 mg/dL — ABNORMAL HIGH (ref 65–99)
Glucose-Capillary: 163 mg/dL — ABNORMAL HIGH (ref 65–99)
Glucose-Capillary: 138 mg/dL — ABNORMAL HIGH (ref 65–99)

## 2017-10-03 MED ORDER — GLUCERNA SHAKE PO LIQD
237.0000 mL | Freq: Two times a day (BID) | ORAL | Status: DC
  Administered 2017-10-04 – 2017-10-07 (×6): 237 mL via ORAL
  Filled 2017-10-03 (×9): qty 237

## 2017-10-03 MED ORDER — MODAFINIL 200 MG PO TABS
100.0000 mg | ORAL_TABLET | Freq: Every day | ORAL | Status: DC
  Administered 2017-10-04 – 2017-10-07 (×4): 100 mg via ORAL
  Filled 2017-10-03 (×4): qty 1

## 2017-10-03 MED ORDER — SODIUM CHLORIDE 0.9% FLUSH
10.0000 mL | INTRAVENOUS | Status: DC | PRN
  Administered 2017-10-04: 20 mL
  Administered 2017-10-06: 10 mL
  Filled 2017-10-03 (×2): qty 40

## 2017-10-03 MED ORDER — COLLAGENASE 250 UNIT/GM EX OINT
TOPICAL_OINTMENT | Freq: Every day | CUTANEOUS | Status: DC
  Administered 2017-10-04 – 2017-10-07 (×4): via TOPICAL
  Filled 2017-10-03: qty 90

## 2017-10-03 MED ORDER — ENSURE ENLIVE PO LIQD
237.0000 mL | Freq: Two times a day (BID) | ORAL | Status: DC
  Administered 2017-10-03 – 2017-10-05 (×4): 237 mL via ORAL

## 2017-10-03 MED ORDER — INFLUENZA VAC SPLIT HIGH-DOSE 0.5 ML IM SUSY
0.5000 mL | PREFILLED_SYRINGE | INTRAMUSCULAR | Status: AC
  Administered 2017-10-04: 0.5 mL via INTRAMUSCULAR
  Filled 2017-10-03: qty 0.5

## 2017-10-03 NOTE — Progress Notes (Signed)
Peripherally Inserted Central Catheter/Midline Placement  The IV Nurse has discussed with the patient and/or persons authorized to consent for the patient, the purpose of this procedure and the potential benefits and risks involved with this procedure.  The benefits include less needle sticks, lab draws from the catheter, and the patient may be discharged home with the catheter. Risks include, but not limited to, infection, bleeding, blood clot (thrombus formation), and puncture of an artery; nerve damage and irregular heartbeat and possibility to perform a PICC exchange if needed/ordered by physician.  Alternatives to this procedure were also discussed.  Bard Power PICC patient education guide, fact sheet on infection prevention and patient information card has been provided to patient /or left at bedside.    PICC/Midline Placement Documentation    LUA [id number]    Sarah Liu 10/03/2017, 7:02 PM

## 2017-10-03 NOTE — Progress Notes (Signed)
PROGRESS NOTE    Sarah Liu  FTD:[id number] DOB: 30-Aug-1946 DOA: 10/02/2017 PCP: Lilian Coma., MD    Brief Narrative:  72 y.o. female with history of diabetes mellitus type 2, peripheral vascular disease status post left AKA, sacral decubitus and heel ulcers, CHF, DVT on Coumadin, CAD, hypothyroidism, history of pancreatic cancer was referred to the ER patient was found to have poor appetite and generally weak.  Patient states she has been having poor appetite and is not liking the food at the skilled nursing facility.  Denies any nausea vomiting or abdominal pain   Assessment & Plan:   Principal Problem:   ARF (acute renal failure) (HCC) Active Problems:   DM (diabetes mellitus), type 2 with renal complications (HCC)   PVD (peripheral vascular disease) (HCC)   CAD (coronary artery disease)   Essential hypertension   1. ARF 1. Likely secondary to pre-renal etiology 2. Clinically dehydrated with poor skin turgor and dry mucus membranes in setting of poor po intake at facility 3. Continue on IVF hydration as tolerated 4. Repeat bmet in AM 2. Failure to thrive 1. Patient reports ""50 pounds [of weight loss] in 3 months" 2. Patient does report feeling constipated 3. Will check abd xray to r/o impaction vs other acute intra-abdominal process 3. DM2 1. Continued on SSI coverage 2. Stable at present 4. PVD 1. S/p AKA 2. Appears stable 5. CAD 1. Denies chest pain 2. Currently on beta blocker and coumadin 6. HTN 1. Stable at present 2. ARB held secondary to decreased renal function 3. Continue on PRN IV hydralazine 7. UTI 1. Continued on rocephin 8. Unstagable sacral decub ulcer 1. Wound care following  DVT prophylaxis: Lovenox subQ Code Status: Full Family Communication: Pt in room, family not at bedside Disposition Plan: Uncertain at this time  Consultants:     Procedures:     Antimicrobials: Anti-infectives (From admission, onward)   Start      Dose/Rate Route Frequency Ordered Stop   10/03/17 2000  cefTRIAXone (ROCEPHIN) 1 g in dextrose 5 % 50 mL IVPB     1 g 100 mL/hr over 30 Minutes Intravenous Every 24 hours 10/02/17 2149     10/02/17 2015  cefTRIAXone (ROCEPHIN) 1 g in dextrose 5 % 50 mL IVPB     1 g 100 mL/hr over 30 Minutes Intravenous  Once 10/02/17 2010 10/02/17 2104       Subjective: Reports continued decreased appetitie  Objective: Vitals:   10/02/17 1930 10/02/17 1945 10/02/17 2000 10/02/17 2228  BP:   (!) 97/48 108/78  Pulse:    85  Resp: (!) 24 (!) 21 20 20   Temp:    98.2 F (36.8 C)  TempSrc:    Oral  SpO2:    100%  Weight:    44 kg (96 lb 14.4 oz)  Height:    5\' 6"  (1.676 m)    Intake/Output Summary (Last 24 hours) at 10/03/2017 1657 Last data filed at 10/02/2017 2104 Gross per 24 hour  Intake 1050 ml  Output -  Net 1050 ml   Filed Weights   10/02/17 2228  Weight: 44 kg (96 lb 14.4 oz)    Examination:  General exam: Appears calm and comfortable, dry membranes Respiratory system: Clear to auscultation. Respiratory effort normal. Cardiovascular system: S1 & S2 heard, RRR. Gastrointestinal system: decreased BS, generally tender to palpation Central nervous system: Alert and oriented. No focal neurological deficits. Skin: No rashes, decreased skin turgor Psychiatry: Judgement and insight appear normal.  Mood & affect appropriate.   Data Reviewed: I have personally reviewed following labs and imaging studies  CBC: Recent Labs  Lab 10/02/17 1725  WBC 8.3  NEUTROABS 6.5  HGB 12.6  HCT 39.3  MCV 92.5  PLT 948*   Basic Metabolic Panel: Recent Labs  Lab 10/02/17 1725 10/03/17 0857  NA 135 137  K 4.8 4.8  CL 105 108  CO2 21* 19*  GLUCOSE 201* 120*  BUN 55* 48*  CREATININE 1.79* 1.41*  CALCIUM 9.0 8.4*   GFR: Estimated Creatinine Clearance: 26.2 mL/min (A) (by C-G formula based on SCr of 1.41 mg/dL (H)). Liver Function Tests: Recent Labs  Lab 10/02/17 1725  AST 78*  ALT 31    ALKPHOS 386*  BILITOT 0.3  PROT 7.1  ALBUMIN 2.0*   No results for input(s): LIPASE, AMYLASE in the last 168 hours. No results for input(s): AMMONIA in the last 168 hours. Coagulation Profile: Recent Labs  Lab 10/02/17 1725  INR 1.24   Cardiac Enzymes: No results for input(s): CKTOTAL, CKMB, CKMBINDEX, TROPONINI in the last 168 hours. BNP (last 3 results) No results for input(s): PROBNP in the last 8760 hours. HbA1C: No results for input(s): HGBA1C in the last 72 hours. CBG: Recent Labs  Lab 10/03/17 0746 10/03/17 1526  GLUCAP 112* 154*   Lipid Profile: No results for input(s): CHOL, HDL, LDLCALC, TRIG, CHOLHDL, LDLDIRECT in the last 72 hours. Thyroid Function Tests: No results for input(s): TSH, T4TOTAL, FREET4, T3FREE, THYROIDAB in the last 72 hours. Anemia Panel: No results for input(s): VITAMINB12, FOLATE, FERRITIN, TIBC, IRON, RETICCTPCT in the last 72 hours. Sepsis Labs: No results for input(s): PROCALCITON, LATICACIDVEN in the last 168 hours.  No results found for this or any previous visit (from the past 240 hour(s)).   Radiology Studies: Dg Chest 2 View  Result Date: 10/02/2017 CLINICAL DATA:  Failure to thrive.  Former smoker. EXAM: CHEST  2 VIEW COMPARISON:  07/02/2017 FINDINGS: Heart size and mediastinal contours are stable and within normal limits. A curvilinear density overlying the periphery of the left lung likely reflects a skin fold artifact as there are lung markings beyond this. Pneumothorax is not entirely excluded but believed less likely. A repeat chest chest radiograph may help for further correlation. Subsegmental atelectasis and/or scarring is seen in the right mid lung. No acute osseous abnormality. IMPRESSION: Curvilinear density along the periphery of the left lung likely representing a skin fold artifact as lung markings are seen beyond this. Pneumothorax is believed less likely. This could be further correlated with a repeat chest radiograph  with right-side-down decubitus views for confirmation. Minimal subpleural atelectasis and/or scarring in the right mid lung, new since prior. Electronically Signed   By: Ashley Royalty M.D.   On: 10/02/2017 17:21   Ct Chest Wo Contrast  Result Date: 10/03/2017 CLINICAL DATA:  Abnormal chest radiograph EXAM: CT CHEST WITHOUT CONTRAST TECHNIQUE: Multidetector CT imaging of the chest was performed following the standard protocol without IV contrast. COMPARISON:  Chest radiographs dated 10/02/2017 FINDINGS: Cardiovascular: The heart is normal in size. No pericardial effusion. No evidence of thoracic aortic aneurysm. Mild atherosclerotic calcifications of the aortic arch. Three vessel coronary atherosclerosis. Mediastinum/Nodes: No suspicious mediastinal lymphadenopathy. Visualized left thyroid is heterogeneous/nodular. Lungs/Pleura: No evidence of pneumothorax. Radiographic abnormality corresponds to a skin fold. Scattered subpleural nodularity in the right lung, including: --3 mm subpleural nodule in the lateral right upper lobe (series 5/image 43) --Two 5 mm subpleural nodules in the posterior right lower  lobe (series 5/image 68), branching, likely infectious --5 mm subpleural nodule in the posterior right lower lobe (series 5/image 88) Additional irregular patchy/branching opacity in the inferolateral right upper lobe (series 5/image 68), favoring mild infection. Left lung is essentially clear. No focal consolidation. No pleural effusion or pneumothorax. Upper Abdomen: Visualized upper abdomen is notable for cholecystectomy clips, vascular calcifications, a 2.2 cm lateral left upper pole renal cyst (series 2/image 119), and splenules in the left upper abdomen. Musculoskeletal: Mild degenerative changes of the visualized thoracolumbar spine. IMPRESSION: No evidence of pneumothorax. Radiographic abnormality corresponds to a skin fold. Mild patchy/nodular opacities in the right lung, most of which favor mild  infection/pneumonia. Underlying scattered small pulmonary nodules measuring up to 5 mm. No follow-up needed if patient is low-risk (and has no known or suspected primary neoplasm). Non-contrast chest CT can be considered in 12 months if patient is high-risk. This recommendation follows the consensus statement: Guidelines for Management of Incidental Pulmonary Nodules Detected on CT Images: From the Fleischner Society 2017; Radiology 2017; 284:228-243. Aortic Atherosclerosis (ICD10-I70.0). Electronically Signed   By: Julian Hy M.D.   On: 10/03/2017 07:14   US Abdomen Limited  Result Date: 10/02/2017 CLINICAL DATA:  Decreased appetite. Elevated alkaline phosphatase. Cholecystectomy. EXAM: ULTRASOUND ABDOMEN LIMITED RIGHT UPPER QUADRANT COMPARISON:  CT of the abdomen and pelvis on 07/03/2017 FINDINGS: Gallbladder: Cholecystectomy. Common bile duct: Diameter: 2.3 millimeters Liver: Mildly heterogeneous without focal liver lesion. Portal vein is patent on color Doppler imaging with normal direction of blood flow towards the liver. IMPRESSION: 1. Status post cholecystectomy. 2.  No evidence for acute  abnormality. Electronically Signed   By: Nolon Nations M.D.   On: 10/02/2017 20:54    Scheduled Meds: . acidophilus  1 capsule Oral Daily  . aspirin EC  81 mg Oral Daily  . atorvastatin  40 mg Oral Daily  . calcitRIOL  0.5 mcg Oral Daily  . carvedilol  6.25 mg Oral BID WC  . collagenase   Topical Daily  . enoxaparin (LOVENOX) injection  30 mg Subcutaneous QHS  . feeding supplement (ENSURE ENLIVE)  237 mL Oral BID BM  . feeding supplement (GLUCERNA SHAKE)  237 mL Oral BID BM  . FLUoxetine  20 mg Oral Daily  . gabapentin  100 mg Oral TID  . [START ON 10/04/2017] Influenza vac split quadrivalent PF  0.5 mL Intramuscular Tomorrow-1000  . insulin aspart  0-9 Units Subcutaneous TID WC  . iron polysaccharides  150 mg Oral Daily  . magnesium oxide  400 mg Oral BID  . megestrol  600 mg Oral Daily  .  mirtazapine  15 mg Oral QHS  . modafinil  100 mg Oral Daily  . pantoprazole  40 mg Oral Daily   Continuous Infusions: . sodium chloride 75 mL/hr at 10/03/17 0051  . cefTRIAXone (ROCEPHIN)  IV       LOS: 0 days   Marylu Lund, MD [facility] Pager [phone]  If 7PM-7AM, please contact night-coverage [email or web address] Password [facility] 10/03/2017, 4:57 PM

## 2017-10-03 NOTE — Consult Note (Signed)
[facility] Nurse wound consult note Reason for Consult: UNstageable pressure injury to sacrum and right buttocks, present on admission Wound type:full thickness pressure injury Greater than 50% devitalized slough Pressure Injury POA: Yes Measurement: 6 cm x 7.4 cm wound bed adherent slough, unable to visualize depth Wound bed:25% pale pink 75% adherent gray slough Drainage (amount, consistency, odor) moderate serosanguinous with foul necrotic odor Periwound:intact Dressing procedure/placement/frequency: Cleanse sacral wound with NS and pat dry.  Apply Santyl to wound bed. Daily.  Will order hydrotherapy to evaluate to facilitate debridement.  Will not follow at this time.  Please re-consult if needed.  Domenic Moras RN BSN [place] Pager [phone]

## 2017-10-03 NOTE — Progress Notes (Signed)
HYDROTHERAPY EVALUATION      10/03/17 1600  Subjective Assessment  Subjective "you all were good"  Patient and Family Stated Goals (none stated. Pt didn't talk much)  Date of Onset (unknown-POA)  Evaluation and Treatment  Evaluation and Treatment Procedures Explained to Patient/Family Yes  Evaluation and Treatment Procedures agreed to  Pressure Injury 10/03/17 Unstageable - Full thickness tissue loss in which the base of the ulcer is covered by slough (yellow, tan, gray, green or brown) and/or eschar (tan, brown or black) in the wound bed. *PT* Hydrotherapy. Sacral and onto R buttock area.  Date First Assessed/Time First Assessed: 10/03/17 1320   Location: Sacrum  Staging: Unstageable - Full thickness tissue loss in which the base of the ulcer is covered by slough (yellow, tan, gray, green or brown) and/or eschar (tan, brown or black) in...  Dressing Type ABD;Barrier Film (skin prep);Gauze (Comment) (Santyl)  Dressing Changed  Dressing Change Frequency Daily  State of Healing Early/partial granulation  Site / Wound Assessment Pink;Yellow;Brown  % Wound base Red or Granulating 75%  % Wound base Yellow/Fibrinous Exudate 25%  Peri-wound Assessment Intact  Wound Length (cm) 7 cm  Wound Width (cm) 6 cm  Wound Depth (cm) 1.5 cm  Wound Surface Area (cm^2) 42 cm^2  Wound Volume (cm^3) 63 cm^3  Undermining (cm) 2.5 (9:00-12:00)  Margins Unattached edges (unapproximated)  Drainage Amount Moderate  Drainage Description Serosanguineous  Treatment Debridement (Selective);Hydrotherapy (Pulse lavage);Packing (Saline gauze) (enzymatic debridement)  Hydrotherapy  Pulsed lavage therapy - wound location sacral  Pulsed Lavage with Suction (psi) 8 psi  Pulsed Lavage with Suction - Normal Saline Used 500 mL  Pulsed Lavage Tip Tip with splash shield  Selective Debridement  Selective Debridement - Location sacrum  Selective Debridement - Tools Used Forceps;Scissors  Selective Debridement - Tissue  Removed yellow and brown slough  Wound Therapy - Assess/Plan/Recommendations  Wound Therapy - Clinical Statement 72 yo female with history of L AKA, DM, PVD, pancreatic cancer, DVT, CHF, CAD admitted from SNF with ARF and sacral decubitus.   Wound Therapy - Functional Problem List Limited mobility due to L AKA. Difficulty repositining in bed.   Factors Delaying/Impairing Wound Healing Diabetes Mellitus;Incontinence;Multiple medical problems  Hydrotherapy Plan Debridement;Dressing change;Patient/family education;Pulsatile lavage with suction  Wound Therapy - Frequency 6X / week  Wound Therapy - Current Recommendations Case manager/social work  Wound Therapy - Follow Up Recommendations Skilled nursing facility  Wound Plan Hydrotherapy, dressing changes, enzymatic debridment, and off-loading/repositioning to facilitate wound healing  Wound Therapy Goals - Improve the function of patient's integumentary system by progressing the wound(s) through the phases of wound healing by:  Decrease Necrotic Tissue to 10%  Decrease Necrotic Tissue - Progress Goal set today  Increase Granulation Tissue to 90%  Increase Granulation Tissue - Progress Goal set today  Goals/treatment plan/discharge plan were made with and agreed upon by patient/family Yes  Time For Goal Achievement 2 weeks  Wound Therapy - Potential for Goals Good    Weston Anna, MPT [phone]

## 2017-10-03 NOTE — Progress Notes (Signed)
Initial Nutrition Assessment  DOCUMENTATION CODES:   Severe malnutrition in context of social or environmental circumstances, Underweight  INTERVENTION:   Glucerna Shake po BID, each supplement provides 220 kcal and 10 grams of protein  Snack daily   If intake continues to remain poor, recommend liberalizing diet to promote intake and weight gain  NUTRITION DIAGNOSIS:   Severe Malnutrition related to social / environmental circumstances as evidenced by severe muscle depletion, severe fat depletion, energy intake < 75% for > or equal to 3 months.  GOAL:   Patient will meet greater than or equal to 90% of their needs  MONITOR:   PO intake, Supplement acceptance, Labs, I & O's, Skin, Weight trends  REASON FOR ASSESSMENT:   Malnutrition Screening Tool, Consult Assessment of nutrition requirement/status  ASSESSMENT:   Pt with PMH of PVD, DM, L AKA, CHF, DVT, hx of pancreatic cancer (2002), and multiple sites of skin breakdown presents from [facility] with poor appetite and weakness found ARF with dehydration.    Discussed pt with NT, reports pt consumed only a couple bits of breakfast consisting of oatmeal and eggs.  Spoke with pt and pt's nephew at bedside.  Pt's nephew expressed multiple concerns regarding pt's living conditions.  Pt reports a poor appetite that has been ongoing since living at the SNF. Pt's nephew states she typically blames poor intake on disliking food options at the facility, however, occasionally does not eat when they bring outside food in. Pt reports PTA she was drinking up to 3 Glucerna Shakes/day (this equates to 660 kcal and 30 grams protein) and only consuming small bites of food, if any. According to this, pt has been consuming < 75% of estimated needs for the past 3 months.   Together report, pt has lost ~50 lbs in 3 months. Unable to use weight loss for malnutrition criteria given pt had L AKA procedure (07/22/17).   Pt refused snacks at  first but was agreeable to fruit and cottage cheese daily. RD discussed the importance of adequate calorie and protein consumption to maintain weight and skin integrity.   Labs reviewed; CBG 112-169, BUN 48, Albumin 2.0 Medications reviewed; Risaquad, calcitriol, sliding scale insulin, magnesium oxide, Megace, Remeron, Protonix  NUTRITION - FOCUSED PHYSICAL EXAM:    Most Recent Value  Orbital Region  Severe depletion  Upper Arm Region  Moderate depletion  Thoracic and Lumbar Region  Unable to assess  Buccal Region  Severe depletion  Temple Region  Severe depletion  Clavicle Bone Region  Severe depletion  Clavicle and Acromion Bone Region  Severe depletion  Scapular Bone Region  Unable to assess  Dorsal Hand  Moderate depletion  Patellar Region  Severe depletion  Anterior Thigh Region  Severe depletion  Posterior Calf Region  Severe depletion     Diet Order:  Diet heart healthy/carb modified Room service appropriate? Yes; Fluid consistency: Thin  EDUCATION NEEDS:   Not appropriate for education at this time  Skin:  Skin Assessment: Skin Integrity Issues: Skin Integrity Issues:: Diabetic Ulcer, Unstageable Unstageable: sacrum Diabetic Ulcer: ankle and heel  Last BM:  10/02/17  Height:   Ht Readings from Last 1 Encounters:  10/02/17 5\' 6"  (1.676 m)   Weight:   Wt Readings from Last 1 Encounters:  10/02/17 96 lb 14.4 oz (44 kg)   Ideal Body Weight:  59 kg  BMI:  Body mass index is 15.64 kg/m. (17.1 kg/m^2) - adjusted for L AKA  Estimated Nutritional Needs:   Kcal:  4445-8483  Protein:  66-75 grams  Fluid:  >/= 1.3 L/d  Parks Ranger, MS, RDN, LDN 10/03/2017 12:00 PM

## 2017-10-03 NOTE — Progress Notes (Signed)
Patient had 6 runs of V-tach then later had 9 runs of V-tach per the central telemetry monitor tech. Note sent to on call doctor.

## 2017-10-04 ENCOUNTER — Inpatient Hospital Stay (HOSPITAL_COMMUNITY): Payer: Medicare Other

## 2017-10-04 DIAGNOSIS — L8990 Pressure ulcer of unspecified site, unspecified stage: Secondary | ICD-10-CM

## 2017-10-04 DIAGNOSIS — I739 Peripheral vascular disease, unspecified: Secondary | ICD-10-CM

## 2017-10-04 DIAGNOSIS — E43 Unspecified severe protein-calorie malnutrition: Secondary | ICD-10-CM

## 2017-10-04 LAB — CBC
HEMATOCRIT: 25.5 % — AB (ref 36.0–46.0)
Hemoglobin: 8.1 g/dL — ABNORMAL LOW (ref 12.0–15.0)
MCH: 29.5 pg (ref 26.0–34.0)
MCHC: 31.8 g/dL (ref 30.0–36.0)
MCV: 92.7 fL (ref 78.0–100.0)
Platelets: 429 10*3/uL — ABNORMAL HIGH (ref 150–400)
RBC: 2.75 MIL/uL — ABNORMAL LOW (ref 3.87–5.11)
RDW: 19.6 % — AB (ref 11.5–15.5)
WBC: 6.5 10*3/uL (ref 4.0–10.5)

## 2017-10-04 LAB — COMPREHENSIVE METABOLIC PANEL
ALBUMIN: 1.5 g/dL — AB (ref 3.5–5.0)
ALT: 27 U/L (ref 14–54)
AST: 51 U/L — AB (ref 15–41)
Alkaline Phosphatase: 281 U/L — ABNORMAL HIGH (ref 38–126)
Anion gap: 5 (ref 5–15)
BILIRUBIN TOTAL: 0.2 mg/dL — AB (ref 0.3–1.2)
BUN: 38 mg/dL — AB (ref 6–20)
CHLORIDE: 110 mmol/L (ref 101–111)
CO2: 22 mmol/L (ref 22–32)
Calcium: 8.2 mg/dL — ABNORMAL LOW (ref 8.9–10.3)
Creatinine, Ser: 1.1 mg/dL — ABNORMAL HIGH (ref 0.44–1.00)
GFR calc Af Amer: 58 mL/min — ABNORMAL LOW (ref >60)
GFR calc non Af Amer: 50 mL/min — ABNORMAL LOW (ref >60)
GLUCOSE: 89 mg/dL (ref 65–99)
POTASSIUM: 4.5 mmol/L (ref 3.5–5.1)
Sodium: 137 mmol/L (ref 135–145)
TOTAL PROTEIN: 5.3 g/dL — AB (ref 6.5–8.1)

## 2017-10-04 LAB — GLUCOSE, CAPILLARY
Glucose-Capillary: 65 mg/dL (ref 65–99)
Glucose-Capillary: 100 mg/dL — ABNORMAL HIGH (ref 65–99)
Glucose-Capillary: 177 mg/dL — ABNORMAL HIGH (ref 65–99)
Glucose-Capillary: 96 mg/dL (ref 65–99)

## 2017-10-04 LAB — MRSA PCR SCREENING: MRSA BY PCR: POSITIVE — AB

## 2017-10-04 MED ORDER — SODIUM CHLORIDE 0.9 % IV BOLUS (SEPSIS)
500.0000 mL | Freq: Once | INTRAVENOUS | Status: AC
  Administered 2017-10-04: 500 mL via INTRAVENOUS

## 2017-10-04 MED ORDER — CHLORHEXIDINE GLUCONATE CLOTH 2 % EX PADS
6.0000 | MEDICATED_PAD | Freq: Every day | CUTANEOUS | Status: DC
  Administered 2017-10-04 – 2017-10-07 (×4): 6 via TOPICAL

## 2017-10-04 MED ORDER — SODIUM CHLORIDE 0.9 % IV BOLUS (SEPSIS)
1000.0000 mL | Freq: Once | INTRAVENOUS | Status: AC
  Administered 2017-10-04: 1000 mL via INTRAVENOUS

## 2017-10-04 MED ORDER — MUPIROCIN 2 % EX OINT
1.0000 "application " | TOPICAL_OINTMENT | Freq: Two times a day (BID) | CUTANEOUS | Status: DC
  Administered 2017-10-04 – 2017-10-07 (×7): 1 via NASAL
  Filled 2017-10-04: qty 22

## 2017-10-04 MED ORDER — MEGESTROL ACETATE 400 MG/10ML PO SUSP
400.0000 mg | Freq: Two times a day (BID) | ORAL | Status: DC
  Administered 2017-10-05 – 2017-10-07 (×5): 400 mg via ORAL
  Filled 2017-10-04 (×5): qty 10

## 2017-10-04 MED ORDER — POLYETHYLENE GLYCOL 3350 17 G PO PACK
17.0000 g | PACK | Freq: Every day | ORAL | Status: DC
  Administered 2017-10-04 – 2017-10-07 (×4): 17 g via ORAL
  Filled 2017-10-04 (×4): qty 1

## 2017-10-04 MED ORDER — SODIUM CHLORIDE 0.9 % IV SOLN
INTRAVENOUS | Status: DC
  Administered 2017-10-04 – 2017-10-06 (×3): via INTRAVENOUS

## 2017-10-04 NOTE — Progress Notes (Signed)
HYDROTHERAPY TREATMENT     10/04/17 1400  Subjective Assessment  Subjective pt did not speak much today  Patient and Family Stated Goals none stated  Date of Onset (unknown-POA)  Evaluation and Treatment  Evaluation and Treatment Procedures Explained to Patient/Family Yes  Evaluation and Treatment Procedures agreed to  Pressure Injury 10/03/17 Unstageable - Full thickness tissue loss in which the base of the ulcer is covered by slough (yellow, tan, gray, green or brown) and/or eschar (tan, brown or black) in the wound bed. *PT* Hydrotherapy. Sacral and onto R buttocks.  Date First Assessed/Time First Assessed: 10/03/17 1320   Location: Sacrum  Staging: Unstageable - Full thickness tissue loss in which the base of the ulcer is covered by slough (yellow, tan, gray, green or brown) and/or eschar (tan, brown or black) in...  Dressing Type ABD;Barrier Film (skin prep);Gauze  (Santyl)  Dressing Changed  Dressing Change Frequency Daily  State of Healing Early/partial granulation  % Wound base Red or Granulating 75%  % Wound base Yellow/Fibrinous Exudate 25%  Peri-wound Assessment Intact  Margins Unattached edges (unapproximated)  Drainage Amount Moderate  Drainage Description Serosanguineous  Treatment Debridement (Selective);Hydrotherapy (Pulse lavage);Packing (Saline gauze) (Enzymatic debridement)  Hydrotherapy  Pulsed lavage therapy - wound location sacral  Pulsed Lavage with Suction (psi) 8 psi  Pulsed Lavage with Suction - Normal Saline Used 500 mL  Pulsed Lavage Tip Tip with splash shield  Selective Debridement  Selective Debridement - Location sacrum  Selective Debridement - Tools Used Forceps;Scissors  Selective Debridement - Tissue Removed yellow slough  Wound Therapy - Assess/Plan/Recommendations  Wound Therapy - Clinical Statement 72 yo female with history of L AKA, DM, PVD, pancreatic cancer, DVT, CHF, CAD admitted from SNF with ARF and sacral decubitus.   Wound Therapy -  Functional Problem List Limited mobility due to L AKA. Difficulty repositining in bed.   Factors Delaying/Impairing Wound Healing Diabetes Mellitus;Incontinence;Multiple medical problems  Hydrotherapy Plan Debridement;Dressing change;Patient/family education;Pulsatile lavage with suction  Wound Therapy - Frequency 6X / week  Wound Therapy - Current Recommendations Case manager/social work  Wound Therapy - Follow Up Recommendations Skilled nursing facility  Wound Plan Hydrotherapy, dressing changes, enzymatic debridment, and off-loading/repositioning to facilitate wound healing  Wound Therapy Goals - Improve the function of patient's integumentary system by progressing the wound(s) through the phases of wound healing by:  Decrease Necrotic Tissue - Progress Progressing toward goal  Increase Granulation Tissue - Progress Progressing toward goal  Goals/treatment plan/discharge plan were made with and agreed upon by patient/family Yes  Time For Goal Achievement 2 weeks  Wound Therapy - Potential for Goals Good    Weston Anna, MPT [phone]

## 2017-10-04 NOTE — Progress Notes (Signed)
Hypoglycemic Event  CBG: 65  Treatment: 4ox orange juice  Symptoms: none  Follow-up CBG: [id number] CBG Result:100  Possible Reasons for Event: poor po intake, pt refusing meals, staff encouraging glucerna intake BID  Comments/MD notified:    Sarah Liu [facility]

## 2017-10-04 NOTE — Progress Notes (Signed)
Medications administered by student RN [id number] with supervision of Clinical Instructor Gerrica Cygan MSN, RN-BC or patient's assigned RN.   

## 2017-10-04 NOTE — Progress Notes (Signed)
PROGRESS NOTE    Sarah Liu  GXQ:[id number] DOB: April 25, 1946 DOA: 10/02/2017 PCP: Lilian Coma., MD    Brief Narrative:  72 y.o. female with history of diabetes mellitus type 2, peripheral vascular disease status post left AKA, sacral decubitus and heel ulcers, CHF, DVT on Coumadin, CAD, hypothyroidism, history of pancreatic cancer was referred to the ER patient was found to have poor appetite and generally weak.  Patient states she has been having poor appetite and is not liking the food at the skilled nursing facility.  Denies any nausea vomiting or abdominal pain   Assessment & Plan:   Principal Problem:   ARF (acute renal failure) (HCC) Active Problems:   DM (diabetes mellitus), type 2 with renal complications (HCC)   PVD (peripheral vascular disease) (HCC)   CAD (coronary artery disease)   Essential hypertension   1. ARF 1. Likely secondary to pre-renal etiology 2. Clinically dehydrated with poor skin turgor and dry mucus membranes in setting of poor po intake at facility 3. Improving with IVF hydration 4. Still poor PO intake, see below 2. Failure to thrive 1. Patient reports ""50 pounds [of weight loss] in 3 months" 2. Patient did report feeling constipated 3. Abd xray reviewed. Stool seen predominantly distally. Will schedule miralax 3. DM2 1. Continued on SSI coverage 2. Hypoglycemic this AM, likely secondary to limited PO intake 4. PVD 1. S/p AKA 2. Appears stable at this time 5. CAD 1. Denies chest pain 2. Currently on beta blocker and coumadin 3. Stable at this time 6. HTN 1. Stable currently 2. ARB held secondary to decreased renal function 3. Continue on PRN IV hydralazine 7. UTI 1. Continued on rocephin 2. afebrile 8. Unstagable sacral decub ulcer 1. Wound care following 2. Underwent hydrotherapy  DVT prophylaxis: Lovenox subQ Code Status: Full Family Communication: Pt in room, family not at bedside Disposition Plan: Uncertain at this  time  Consultants:     Procedures:     Antimicrobials: Anti-infectives (From admission, onward)   Start     Dose/Rate Route Frequency Ordered Stop   10/03/17 2000  cefTRIAXone (ROCEPHIN) 1 g in dextrose 5 % 50 mL IVPB     1 g 100 mL/hr over 30 Minutes Intravenous Every 24 hours 10/02/17 2149     10/02/17 2015  cefTRIAXone (ROCEPHIN) 1 g in dextrose 5 % 50 mL IVPB     1 g 100 mL/hr over 30 Minutes Intravenous  Once 10/02/17 2010 10/02/17 2104      Subjective: Still no BM. Reports continued constipation  Objective: Vitals:   10/02/17 2228 10/03/17 2047 10/04/17 0430 10/04/17 1106  BP: 108/78 90/60 94/68    Pulse: 85 90 95   Resp: 20 20 20    Temp: 98.2 F (36.8 C) 97.6 F (36.4 C) 99.1 F (37.3 C)   TempSrc: Oral Oral Oral   SpO2: 100% 100% 100%   Weight: 44 kg (96 lb 14.4 oz)  99.8 kg (220 lb 0.3 oz) 47.3 kg (104 lb 4.8 oz)  Height: 5\' 6"  (1.676 m)       Intake/Output Summary (Last 24 hours) at 10/04/2017 1343 Last data filed at 10/04/2017 0434 Gross per 24 hour  Intake 600 ml  Output 450 ml  Net 150 ml   Filed Weights   10/02/17 2228 10/04/17 0430 10/04/17 1106  Weight: 44 kg (96 lb 14.4 oz) 99.8 kg (220 lb 0.3 oz) 47.3 kg (104 lb 4.8 oz)    Examination: General exam: Awake, laying in bed, in  nad Respiratory system: Normal respiratory effort, no wheezing Cardiovascular system: regular rate, s1, s2 Gastrointestinal system: Soft, nondistended, positive BS Central nervous system: CN2-12 grossly intact, strength intact Extremities: Perfused, no clubbing Skin: Normal skin turgor, no notable skin lesions seen Psychiatry: Mood normal // no visual hallucinations   Data Reviewed: I have personally reviewed following labs and imaging studies  CBC: Recent Labs  Lab 10/02/17 1725 10/04/17 0316  WBC 8.3 6.5  NEUTROABS 6.5  --   HGB 12.6 8.1*  HCT 39.3 25.5*  MCV 92.5 92.7  PLT 606* 960*   Basic Metabolic Panel: Recent Labs  Lab 10/02/17 1725 10/03/17 0857  10/04/17 0316  NA 135 137 137  K 4.8 4.8 4.5  CL 105 108 110  CO2 21* 19* 22  GLUCOSE 201* 120* 89  BUN 55* 48* 38*  CREATININE 1.79* 1.41* 1.10*  CALCIUM 9.0 8.4* 8.2*   GFR: Estimated Creatinine Clearance: 36 mL/min (A) (by C-G formula based on SCr of 1.1 mg/dL (H)). Liver Function Tests: Recent Labs  Lab 10/02/17 1725 10/04/17 0316  AST 78* 51*  ALT 31 27  ALKPHOS 386* 281*  BILITOT 0.3 0.2*  PROT 7.1 5.3*  ALBUMIN 2.0* 1.5*   No results for input(s): LIPASE, AMYLASE in the last 168 hours. No results for input(s): AMMONIA in the last 168 hours. Coagulation Profile: Recent Labs  Lab 10/02/17 1725  INR 1.24   Cardiac Enzymes: No results for input(s): CKTOTAL, CKMB, CKMBINDEX, TROPONINI in the last 168 hours. BNP (last 3 results) No results for input(s): PROBNP in the last 8760 hours. HbA1C: No results for input(s): HGBA1C in the last 72 hours. CBG: Recent Labs  Lab 10/03/17 1837 10/03/17 2105 10/04/17 0742 10/04/17 0851 10/04/17 1121  GLUCAP 163* 138* 65 100* 177*   Lipid Profile: No results for input(s): CHOL, HDL, LDLCALC, TRIG, CHOLHDL, LDLDIRECT in the last 72 hours. Thyroid Function Tests: No results for input(s): TSH, T4TOTAL, FREET4, T3FREE, THYROIDAB in the last 72 hours. Anemia Panel: No results for input(s): VITAMINB12, FOLATE, FERRITIN, TIBC, IRON, RETICCTPCT in the last 72 hours. Sepsis Labs: No results for input(s): PROCALCITON, LATICACIDVEN in the last 168 hours.  Recent Results (from the past 240 hour(s))  Urine Culture     Status: Abnormal (Preliminary result)   Collection Time: 10/02/17  4:16 PM  Result Value Ref Range Status   Specimen Description   Final    URINE, RANDOM Performed at [facility] [address], [place], [place] [address]    Special Requests   Final    NONE Performed at [facility], [place] [address], [place], [place] [address]    Culture (A)  Final    60,000 COLONIES/mL  STAPHYLOCOCCUS AUREUS SUSCEPTIBILITIES TO FOLLOW Performed at [facility], [place] [address], [place],  [address]    Report Status PENDING  Incomplete  MRSA PCR Screening     Status: Abnormal   Collection Time: 10/03/17  6:44 PM  Result Value Ref Range Status   MRSA by PCR POSITIVE (A) NEGATIVE Final    Comment:        The GeneXpert MRSA Assay (FDA approved for NASAL specimens only), is one component of a comprehensive MRSA colonization surveillance program. It is not intended to diagnose MRSA infection nor to guide or monitor treatment for MRSA infections. RESULT CALLED TO, READ BACK BY AND VERIFIED WITH: Artis Delay RN 0122 10/04/17 A NAVARRO Performed at [facility], [place] [address]  [place], [place] [address]      Radiology Studies: Dg Chest 2 View  Result Date: 10/02/2017 CLINICAL DATA:  Failure to thrive.  Former smoker. EXAM: CHEST  2 VIEW COMPARISON:  07/02/2017 FINDINGS: Heart size and mediastinal contours are stable and within normal limits. A curvilinear density overlying the periphery of the left lung likely reflects a skin fold artifact as there are lung markings beyond this. Pneumothorax is not entirely excluded but believed less likely. A repeat chest chest radiograph may help for further correlation. Subsegmental atelectasis and/or scarring is seen in the right mid lung. No acute osseous abnormality. IMPRESSION: Curvilinear density along the periphery of the left lung likely representing a skin fold artifact as lung markings are seen beyond this. Pneumothorax is believed less likely. This could be further correlated with a repeat chest radiograph with right-side-down decubitus views for confirmation. Minimal subpleural atelectasis and/or scarring in the right mid lung, new since prior. Electronically Signed   By: Ashley Royalty M.D.   On: 10/02/2017 17:21   Ct Chest Wo Contrast  Result Date: 10/03/2017 CLINICAL DATA:  Abnormal chest radiograph  EXAM: CT CHEST WITHOUT CONTRAST TECHNIQUE: Multidetector CT imaging of the chest was performed following the standard protocol without IV contrast. COMPARISON:  Chest radiographs dated 10/02/2017 FINDINGS: Cardiovascular: The heart is normal in size. No pericardial effusion. No evidence of thoracic aortic aneurysm. Mild atherosclerotic calcifications of the aortic arch. Three vessel coronary atherosclerosis. Mediastinum/Nodes: No suspicious mediastinal lymphadenopathy. Visualized left thyroid is heterogeneous/nodular. Lungs/Pleura: No evidence of pneumothorax. Radiographic abnormality corresponds to a skin fold. Scattered subpleural nodularity in the right lung, including: --3 mm subpleural nodule in the lateral right upper lobe (series 5/image 43) --Two 5 mm subpleural nodules in the posterior right lower lobe (series 5/image 68), branching, likely infectious --5 mm subpleural nodule in the posterior right lower lobe (series 5/image 88) Additional irregular patchy/branching opacity in the inferolateral right upper lobe (series 5/image 68), favoring mild infection. Left lung is essentially clear. No focal consolidation. No pleural effusion or pneumothorax. Upper Abdomen: Visualized upper abdomen is notable for cholecystectomy clips, vascular calcifications, a 2.2 cm lateral left upper pole renal cyst (series 2/image 119), and splenules in the left upper abdomen. Musculoskeletal: Mild degenerative changes of the visualized thoracolumbar spine. IMPRESSION: No evidence of pneumothorax. Radiographic abnormality corresponds to a skin fold. Mild patchy/nodular opacities in the right lung, most of which favor mild infection/pneumonia. Underlying scattered small pulmonary nodules measuring up to 5 mm. No follow-up needed if patient is low-risk (and has no known or suspected primary neoplasm). Non-contrast chest CT can be considered in 12 months if patient is high-risk. This recommendation follows the consensus statement:  Guidelines for Management of Incidental Pulmonary Nodules Detected on CT Images: From the Fleischner Society 2017; Radiology 2017; 284:228-243. Aortic Atherosclerosis (ICD10-I70.0). Electronically Signed   By: Julian Hy M.D.   On: 10/03/2017 07:14   US Abdomen Limited  Result Date: 10/02/2017 CLINICAL DATA:  Decreased appetite. Elevated alkaline phosphatase. Cholecystectomy. EXAM: ULTRASOUND ABDOMEN LIMITED RIGHT UPPER QUADRANT COMPARISON:  CT of the abdomen and pelvis on 07/03/2017 FINDINGS: Gallbladder: Cholecystectomy. Common bile duct: Diameter: 2.3 millimeters Liver: Mildly heterogeneous without focal liver lesion. Portal vein is patent on color Doppler imaging with normal direction of blood flow towards the liver. IMPRESSION: 1. Status post cholecystectomy. 2.  No evidence for acute  abnormality. Electronically Signed   By: Nolon Nations M.D.   On: 10/02/2017 20:54   Dg Abd Portable 1v  Result Date: 10/04/2017  CLINICAL DATA:  Constipation and abdominal pain EXAM: PORTABLE ABDOMEN - 1 VIEW COMPARISON:  None. FINDINGS: Scattered large and small bowel gas is noted. No obstructive changes are seen. Diffuse vascular calcifications are seen. Postoperative changes are noted. No acute bony abnormality is seen. IMPRESSION: No acute abnormality noted. Electronically Signed   By: Inez Catalina M.D.   On: 10/04/2017 11:27    Scheduled Meds: . acidophilus  1 capsule Oral Daily  . aspirin EC  81 mg Oral Daily  . atorvastatin  40 mg Oral Daily  . calcitRIOL  0.5 mcg Oral Daily  . carvedilol  6.25 mg Oral BID WC  . Chlorhexidine Gluconate Cloth  6 each Topical Q0600  . collagenase   Topical Daily  . enoxaparin (LOVENOX) injection  30 mg Subcutaneous QHS  . feeding supplement (ENSURE ENLIVE)  237 mL Oral BID BM  . feeding supplement (GLUCERNA SHAKE)  237 mL Oral BID BM  . FLUoxetine  20 mg Oral Daily  . gabapentin  100 mg Oral TID  . insulin aspart  0-9 Units Subcutaneous TID WC  . iron  polysaccharides  150 mg Oral Daily  . magnesium oxide  400 mg Oral BID  . megestrol  600 mg Oral Daily  . mirtazapine  15 mg Oral QHS  . modafinil  100 mg Oral Daily  . mupirocin ointment  1 application Nasal BID  . pantoprazole  40 mg Oral Daily  . polyethylene glycol  17 g Oral Daily   Continuous Infusions: . cefTRIAXone (ROCEPHIN)  IV Stopped (10/04/17 0053)     LOS: 1 day   Marylu Lund, MD [facility] Pager [phone]  If 7PM-7AM, please contact night-coverage [email or web address] Password [email or web address] 10/04/2017, 1:43 PM

## 2017-10-05 DIAGNOSIS — I2510 Atherosclerotic heart disease of native coronary artery without angina pectoris: Secondary | ICD-10-CM

## 2017-10-05 LAB — GLUCOSE, CAPILLARY
GLUCOSE-CAPILLARY: 211 mg/dL — AB (ref 65–99)
Glucose-Capillary: 287 mg/dL — ABNORMAL HIGH (ref 65–99)
Glucose-Capillary: 125 mg/dL — ABNORMAL HIGH (ref 65–99)
Glucose-Capillary: 165 mg/dL — ABNORMAL HIGH (ref 65–99)
Glucose-Capillary: 86 mg/dL (ref 65–99)

## 2017-10-05 LAB — URINE CULTURE: Culture: 60000 — AB

## 2017-10-05 LAB — COMPREHENSIVE METABOLIC PANEL
ALK PHOS: 224 U/L — AB (ref 38–126)
ALT: 26 U/L (ref 14–54)
AST: 59 U/L — ABNORMAL HIGH (ref 15–41)
Albumin: 1.1 g/dL — ABNORMAL LOW (ref 3.5–5.0)
Anion gap: 3 — ABNORMAL LOW (ref 5–15)
BUN: 25 mg/dL — ABNORMAL HIGH (ref 6–20)
CALCIUM: 6.8 mg/dL — AB (ref 8.9–10.3)
CO2: 16 mmol/L — AB (ref 22–32)
Chloride: 117 mmol/L — ABNORMAL HIGH (ref 101–111)
Creatinine, Ser: 0.81 mg/dL (ref 0.44–1.00)
GFR calc non Af Amer: >60 mL/min (ref >60)
Glucose, Bld: 153 mg/dL — ABNORMAL HIGH (ref 65–99)
Potassium: 4.3 mmol/L (ref 3.5–5.1)
SODIUM: 136 mmol/L (ref 135–145)
TOTAL PROTEIN: 3.9 g/dL — AB (ref 6.5–8.1)
Total Bilirubin: 0.2 mg/dL — ABNORMAL LOW (ref 0.3–1.2)

## 2017-10-05 LAB — CBC
HEMATOCRIT: 23.2 % — AB (ref 36.0–46.0)
Hemoglobin: 7.4 g/dL — ABNORMAL LOW (ref 12.0–15.0)
MCH: 29.6 pg (ref 26.0–34.0)
MCHC: 31.9 g/dL (ref 30.0–36.0)
MCV: 92.8 fL (ref 78.0–100.0)
PLATELETS: 367 10*3/uL (ref 150–400)
RBC: 2.5 MIL/uL — AB (ref 3.87–5.11)
RDW: 19.7 % — ABNORMAL HIGH (ref 11.5–15.5)
WBC: 6 10*3/uL (ref 4.0–10.5)

## 2017-10-05 MED ORDER — NITROFURANTOIN MONOHYD MACRO 100 MG PO CAPS
100.0000 mg | ORAL_CAPSULE | Freq: Two times a day (BID) | ORAL | Status: DC
  Administered 2017-10-05 – 2017-10-07 (×4): 100 mg via ORAL
  Filled 2017-10-05 (×4): qty 1

## 2017-10-05 MED ORDER — DOXYCYCLINE HYCLATE 100 MG IV SOLR
100.0000 mg | Freq: Two times a day (BID) | INTRAVENOUS | Status: DC
  Administered 2017-10-05 – 2017-10-07 (×4): 100 mg via INTRAVENOUS
  Filled 2017-10-05 (×5): qty 100

## 2017-10-05 MED ORDER — VANCOMYCIN HCL IN DEXTROSE 1-5 GM/200ML-% IV SOLN
1000.0000 mg | Freq: Once | INTRAVENOUS | Status: AC
  Administered 2017-10-05: 1000 mg via INTRAVENOUS
  Filled 2017-10-05: qty 200

## 2017-10-05 NOTE — Progress Notes (Signed)
PROGRESS NOTE  Arlissa Monteverde OAC:[id number] DOB: 23-Nov-1945 DOA: 10/02/2017 PCP: Lilian Coma., MD   LOS: 2 days   Brief Narrative / Interim history: 72 year old female with type 2 diabetes mellitus, peripheral vascular disease status post left AKA in 2018, unstageable sacral decubitus, DVT on Coumadin, coronary artery disease, hypothyroidism, diabetes mellitus, prior pancreatic cancer, was being brought to the hospital due to poor appetite/generalized weakness.  She was found to have acute renal failure and was admitted to the hospital.  Assessment & Plan: Principal Problem:   ARF (acute renal failure) (HCC) Active Problems:   DM (diabetes mellitus), type 2 with renal complications (HCC)   PVD (peripheral vascular disease) (HCC)   CAD (coronary artery disease)   Essential hypertension   Protein-calorie malnutrition, severe   Pressure injury of skin   Acute kidney injury -Likely in the setting of poor p.o. intake, patient received IV fluids and creatinine has now normalized  History of pancreatic cancer -Unclear as to history regarding  disease, she recently had a CT scan of the abdomen and pelvis just couple months ago at [facility] and there was without acute findings -Patient tells me that she was diagnosed with pancreatic cancer about 13 years ago, does not know exactly how she was treated but tells me this is all resolved  Failure to thrive -Patient states that she does not like the food in the SNF, tells me that the family is to bring her food from what they called the restaurant she would have no problem eating that.  She denies any abdominal pain, nausea or vomiting.  Type 2 diabetes mellitus -Continue sliding scale  Peripheral vascular disease -Status post AKA  Coronary artery disease -No chest pain, continue beta-blockers  Urinary tract infection -Urine cultures with MRSA as well as enterococcus, based on sensitivities will narrow to doxycycline &  nitrofurantoin.  Patient without significant dysuria / frequency for UTI, no fevers or chills or systemic symptoms, perhaps contaminant but will need to treat fur the sacral ulcer anyway  Unstageable sacral decubitus ulcer -Wound care consulted, patient needs hydrotherapy, will cover with doxycycline as above for MRSA   DVT prophylaxis: Lovneox Code Status: Full code Family Communication: no family at bedside Disposition Plan: SNF when stable, 1-2 days   Consultants:   None   Procedures:   None   Antimicrobials:  Ceftriaxone 2/3 >> 2/6  Vancomycin x 1 2/6  Doxycycline 2/6 >>  Nitrofurantoin 2/6 >>  Subjective: - no chest pain, shortness of breath, no abdominal pain, nausea or vomiting.   Objective: Vitals:   10/04/17 1932 10/04/17 2124 10/05/17 0408 10/05/17 0802  BP: (!) 89/54 (!) 92/46 (!) 94/56 (!) 97/57  Pulse:  77 92 88  Resp:  18 18 16   Temp:  99.5 F (37.5 C) 99.6 F (37.6 C)   TempSrc:  Oral Oral   SpO2:  100% 99% 100%  Weight:   46.9 kg (103 lb 6.4 oz)   Height:        Intake/Output Summary (Last 24 hours) at 10/05/2017 1449 Last data filed at 10/05/2017 1400 Gross per 24 hour  Intake 3647.5 ml  Output 1125 ml  Net 2522.5 ml   Filed Weights   10/04/17 0430 10/04/17 1106 10/05/17 0408  Weight: 99.8 kg (220 lb 0.3 oz) 47.3 kg (104 lb 4.8 oz) 46.9 kg (103 lb 6.4 oz)    Examination:  Constitutional: NAD Eyes: lids and conjunctivae normal ENMT: Mucous membranes are moist. Neck: normal, supple Respiratory: clear  to auscultation bilaterally, no wheezing, no crackles. Cardiovascular: Regular rate and rhythm, no murmurs / rubs / gallops. Abdomen: no tenderness. Bowel sounds positive.  Skin: 6 x 6 cm full-thickness pressure injury on the sacrum Neurologic: CN 2-12 grossly intact. Strength 5/5 in all 4.  Psychiatric: Normal judgment and insight. Alert and oriented x 3. Normal mood.    Data Reviewed: I have independently reviewed following labs and  imaging studies   CBC: Recent Labs  Lab 10/02/17 1725 10/04/17 0316 10/05/17 1120  WBC 8.3 6.5 6.0  NEUTROABS 6.5  --   --   HGB 12.6 8.1* 7.4*  HCT 39.3 25.5* 23.2*  MCV 92.5 92.7 92.8  PLT 606* 429* 132   Basic Metabolic Panel: Recent Labs  Lab 10/02/17 1725 10/03/17 0857 10/04/17 0316 10/05/17 0306  NA 135 137 137 136  K 4.8 4.8 4.5 4.3  CL 105 108 110 117*  CO2 21* 19* 22 16*  GLUCOSE 201* 120* 89 153*  BUN 55* 48* 38* 25*  CREATININE 1.79* 1.41* 1.10* 0.81  CALCIUM 9.0 8.4* 8.2* 6.8*   GFR: Estimated Creatinine Clearance: 48.5 mL/min (by C-G formula based on SCr of 0.81 mg/dL). Liver Function Tests: Recent Labs  Lab 10/02/17 1725 10/04/17 0316 10/05/17 0306  AST 78* 51* 59*  ALT 31 27 26   ALKPHOS 386* 281* 224*  BILITOT 0.3 0.2* 0.2*  PROT 7.1 5.3* 3.9*  ALBUMIN 2.0* 1.5* 1.1*   No results for input(s): LIPASE, AMYLASE in the last 168 hours. No results for input(s): AMMONIA in the last 168 hours. Coagulation Profile: Recent Labs  Lab 10/02/17 1725  INR 1.24   Cardiac Enzymes: No results for input(s): CKTOTAL, CKMB, CKMBINDEX, TROPONINI in the last 168 hours. BNP (last 3 results) No results for input(s): PROBNP in the last 8760 hours. HbA1C: No results for input(s): HGBA1C in the last 72 hours. CBG: Recent Labs  Lab 10/04/17 1121 10/04/17 1628 10/04/17 2126 10/05/17 0753 10/05/17 1235  GLUCAP 177* 96 287* 125* 165*   Lipid Profile: No results for input(s): CHOL, HDL, LDLCALC, TRIG, CHOLHDL, LDLDIRECT in the last 72 hours. Thyroid Function Tests: No results for input(s): TSH, T4TOTAL, FREET4, T3FREE, THYROIDAB in the last 72 hours. Anemia Panel: No results for input(s): VITAMINB12, FOLATE, FERRITIN, TIBC, IRON, RETICCTPCT in the last 72 hours. Urine analysis:    Component Value Date/Time   COLORURINE AMBER (A) 10/02/2017 1649   APPEARANCEUR CLOUDY (A) 10/02/2017 1649   LABSPEC 1.023 10/02/2017 1649   PHURINE 5.0 10/02/2017 1649    GLUCOSEU NEGATIVE 10/02/2017 1649   HGBUR SMALL (A) 10/02/2017 1649   BILIRUBINUR NEGATIVE 10/02/2017 1649   KETONESUR NEGATIVE 10/02/2017 1649   PROTEINUR NEGATIVE 10/02/2017 1649   UROBILINOGEN 0.2 11/06/2013 1426   NITRITE NEGATIVE 10/02/2017 1649   LEUKOCYTESUR LARGE (A) 10/02/2017 1649   Sepsis Labs: Invalid input(s): PROCALCITONIN, LACTICIDVEN  Recent Results (from the past 240 hour(s))  Urine Culture     Status: Abnormal   Collection Time: 10/02/17  4:16 PM  Result Value Ref Range Status   Specimen Description   Final    URINE, RANDOM Performed at [facility] [address] [place], [place] [address]    Special Requests   Final    NONE Performed at [facility], [place] [address], [place], [place] [address]    Culture (A)  Final    60,000 COLONIES/mL STAPHYLOCOCCUS AUREUS 60,000 COLONIES/mL ENTEROCOCCUS FAECALIS    Report Status 10/05/2017 FINAL  Final  Organism ID, Bacteria STAPHYLOCOCCUS AUREUS (A)  Final   Organism ID, Bacteria ENTEROCOCCUS FAECALIS (A)  Final      Susceptibility   Enterococcus faecalis - MIC*    AMPICILLIN <=2 SENSITIVE Sensitive     LEVOFLOXACIN >=8 RESISTANT Resistant     NITROFURANTOIN <=16 SENSITIVE Sensitive     VANCOMYCIN 1 SENSITIVE Sensitive     * 60,000 COLONIES/mL ENTEROCOCCUS FAECALIS   Staphylococcus aureus - MIC*    CIPROFLOXACIN >=8 RESISTANT Resistant     GENTAMICIN <=0.5 SENSITIVE Sensitive     NITROFURANTOIN <=16 SENSITIVE Sensitive     OXACILLIN >=4 RESISTANT Resistant     TETRACYCLINE <=1 SENSITIVE Sensitive     VANCOMYCIN <=0.5 SENSITIVE Sensitive     TRIMETH/SULFA 80 RESISTANT Resistant     CLINDAMYCIN >=8 RESISTANT Resistant     RIFAMPIN <=0.5 SENSITIVE Sensitive     Inducible Clindamycin NEGATIVE Sensitive     * 60,000 COLONIES/mL STAPHYLOCOCCUS AUREUS  MRSA PCR Screening     Status: Abnormal   Collection Time: 10/03/17  6:44 PM  Result Value Ref Range Status   MRSA by PCR  POSITIVE (A) NEGATIVE Final    Comment:        The GeneXpert MRSA Assay (FDA approved for NASAL specimens only), is one component of a comprehensive MRSA colonization surveillance program. It is not intended to diagnose MRSA infection nor to guide or monitor treatment for MRSA infections. RESULT CALLED TO, READ BACK BY AND VERIFIED WITH: Artis Delay RN 0122 10/04/17 A NAVARRO Performed at [facility], [place] [address], [place], [place] [address]       Radiology Studies: Dg Abd Portable 1v  Result Date: 10/04/2017 CLINICAL DATA:  Constipation and abdominal pain EXAM: PORTABLE ABDOMEN - 1 VIEW COMPARISON:  None. FINDINGS: Scattered large and small bowel gas is noted. No obstructive changes are seen. Diffuse vascular calcifications are seen. Postoperative changes are noted. No acute bony abnormality is seen. IMPRESSION: No acute abnormality noted. Electronically Signed   By: Inez Catalina M.D.   On: 10/04/2017 11:27     Scheduled Meds: . acidophilus  1 capsule Oral Daily  . aspirin EC  81 mg Oral Daily  . atorvastatin  40 mg Oral Daily  . calcitRIOL  0.5 mcg Oral Daily  . carvedilol  6.25 mg Oral BID WC  . Chlorhexidine Gluconate Cloth  6 each Topical Q0600  . collagenase   Topical Daily  . enoxaparin (LOVENOX) injection  30 mg Subcutaneous QHS  . feeding supplement (ENSURE ENLIVE)  237 mL Oral BID BM  . feeding supplement (GLUCERNA SHAKE)  237 mL Oral BID BM  . FLUoxetine  20 mg Oral Daily  . gabapentin  100 mg Oral TID  . insulin aspart  0-9 Units Subcutaneous TID WC  . iron polysaccharides  150 mg Oral Daily  . magnesium oxide  400 mg Oral BID  . megestrol  400 mg Oral BID  . mirtazapine  15 mg Oral QHS  . modafinil  100 mg Oral Daily  . mupirocin ointment  1 application Nasal BID  . pantoprazole  40 mg Oral Daily  . polyethylene glycol  17 g Oral Daily   Continuous Infusions: . sodium chloride 75 mL/hr at 10/05/17 [place], MD,  PhD [facility] Pager [phone]  If 7PM-7AM, please contact night-coverage [email or web address] Password [email or web address] 10/05/2017, 2:49 PM
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
## 2017-10-05 NOTE — Progress Notes (Signed)
HYDROTHERAPY TREATMENT  Dressing was soiled. Feces had seeped into wound as well. Difficult getting wound completely clean due to pain. Pt tolerated treatment as best she could.    10/05/17 1500  Subjective Assessment  Subjective that was rough. (Pt tearful during session on today)  Patient and Family Stated Goals none stated  Date of Onset (unknown POA)  Evaluation and Treatment  Evaluation and Treatment Procedures Explained to Patient/Family Yes  Evaluation and Treatment Procedures agreed to  Pressure Injury 10/03/17 Unstageable - Full thickness tissue loss in which the base of the ulcer is covered by slough (yellow, tan, gray, green or brown) and/or eschar (tan, brown or black) in the wound bed. *PT* Hydrotherapy. Sacral and onto R buttocks.  Date First Assessed/Time First Assessed: 10/03/17 1320   Location: Sacrum  Staging: Unstageable - Full thickness tissue loss in which the base of the ulcer is covered by slough (yellow, tan, gray, green or brown) and/or eschar (tan, brown or black) in...  Dressing Type ABD;Barrier Film (skin prep);Gauze (Comment) (Santyl)  Dressing Changed (soiled with feces)  Dressing Change Frequency Daily  State of Healing Early/partial granulation  Site / Wound Assessment Pink;Yellow;Bleeding  % Wound base Red or Granulating 75%  % Wound base Yellow/Fibrinous Exudate 25% (with some fibrous tissue noted)  Peri-wound Assessment Pink;Excoriated  Drainage Amount Moderate  Drainage Description Serosanguineous  Treatment Debridement (Selective);Hydrotherapy (Pulse lavage);Packing (Saline gauze) (Enzymatic debridement)  Hydrotherapy  Pulsed lavage therapy - wound location sacral  Pulsed Lavage with Suction (psi) 8 psi  Pulsed Lavage with Suction - Normal Saline Used 1000 mL  Pulsed Lavage Tip Tip with splash shield  Selective Debridement  Selective Debridement - Location sacrum  Selective Debridement - Tools Used Forceps;Scissors  Selective Debridement -  Tissue Removed yellow slough  Wound Therapy - Assess/Plan/Recommendations  Wound Therapy - Clinical Statement 72 yo female with history of L AKA, DM, PVD, pancreatic cancer, DVT, CHF, CAD admitted from SNF with ARF and sacral decubitus.   Wound Therapy - Functional Problem List Limited mobility due to L AKA. Difficulty repositining in bed.   Factors Delaying/Impairing Wound Healing Diabetes Mellitus;Incontinence;Multiple medical problems  Hydrotherapy Plan Debridement;Dressing change;Patient/family education;Pulsatile lavage with suction  Wound Therapy - Frequency 6X / week  Wound Therapy - Current Recommendations Case manager/social work  Wound Therapy - Follow Up Recommendations Skilled nursing facility  Wound Plan Hydrotherapy, dressing changes, enzymatic debridment, and off-loading/repositioning to facilitate wound healing  Wound Therapy Goals - Improve the function of patient's integumentary system by progressing the wound(s) through the phases of wound healing by:  Decrease Necrotic Tissue to 10%  Decrease Necrotic Tissue - Progress Progressing toward goal  Increase Granulation Tissue to 90%  Increase Granulation Tissue - Progress Progressing toward goal  Goals/treatment plan/discharge plan were made with and agreed upon by patient/family Yes  Time For Goal Achievement 2 weeks  Wound Therapy - Potential for Goals Good   Weston Anna, MPT [phone]

## 2017-10-05 NOTE — Plan of Care (Signed)
  Progressing Health Behavior/Discharge Planning: Ability to manage health-related needs will improve 10/05/2017 1008 - Progressing by Kerrin Mo, RN Clinical Measurements: Ability to maintain clinical measurements within normal limits will improve 10/05/2017 1008 - Progressing by Kerrin Mo, RN Will remain free from infection 10/05/2017 1008 - Progressing by Kerrin Mo, RN Note IV abx Diagnostic test results will improve 10/05/2017 1008 - Progressing by Kerrin Mo, RN Respiratory complications will improve 10/05/2017 1008 - Progressing by Kerrin Mo, RN Cardiovascular complication will be avoided 10/05/2017 1008 - Progressing by Kerrin Mo, RN Activity: Risk for activity intolerance will decrease 10/05/2017 1008 - Progressing by Kerrin Mo, RN Nutrition: Adequate nutrition will be maintained 10/05/2017 1008 - Progressing by Kerrin Mo, RN Note Refused breakfast this AM but drank entire Glucerna and asked for Ensure after. Will continue to encourage PO intake.  Elimination: Will not experience complications related to bowel motility 10/05/2017 1008 - Progressing by Kerrin Mo, RN Will not experience complications related to urinary retention 10/05/2017 1008 - Progressing by Kerrin Mo, RN Pain Managment: General experience of comfort will improve 10/05/2017 1008 - Progressing by Kerrin Mo, RN Skin Integrity: Risk for impaired skin integrity will decrease 10/05/2017 1008 - Progressing by Kerrin Mo, RN Note Unstageable pressure injury to sacrum.  Receiving hydrotherapy daily.

## 2017-10-06 LAB — BASIC METABOLIC PANEL
Anion gap: 3 — ABNORMAL LOW (ref 5–15)
BUN: 20 mg/dL (ref 6–20)
CHLORIDE: 114 mmol/L — AB (ref 101–111)
CO2: 20 mmol/L — ABNORMAL LOW (ref 22–32)
CREATININE: 0.87 mg/dL (ref 0.44–1.00)
Calcium: 8 mg/dL — ABNORMAL LOW (ref 8.9–10.3)
GFR calc Af Amer: >60 mL/min (ref >60)
GLUCOSE: 165 mg/dL — AB (ref 65–99)
POTASSIUM: 4.8 mmol/L (ref 3.5–5.1)
SODIUM: 137 mmol/L (ref 135–145)

## 2017-10-06 LAB — GLUCOSE, CAPILLARY
GLUCOSE-CAPILLARY: 89 mg/dL (ref 65–99)
Glucose-Capillary: 162 mg/dL — ABNORMAL HIGH (ref 65–99)
Glucose-Capillary: 69 mg/dL (ref 65–99)
Glucose-Capillary: 96 mg/dL (ref 65–99)
Glucose-Capillary: 156 mg/dL — ABNORMAL HIGH (ref 65–99)

## 2017-10-06 LAB — CBC
HEMATOCRIT: 24.7 % — AB (ref 36.0–46.0)
Hemoglobin: 8 g/dL — ABNORMAL LOW (ref 12.0–15.0)
MCH: 30.3 pg (ref 26.0–34.0)
MCHC: 32.4 g/dL (ref 30.0–36.0)
MCV: 93.6 fL (ref 78.0–100.0)
PLATELETS: 367 10*3/uL (ref 150–400)
RBC: 2.64 MIL/uL — ABNORMAL LOW (ref 3.87–5.11)
RDW: 20 % — ABNORMAL HIGH (ref 11.5–15.5)
WBC: 5.2 10*3/uL (ref 4.0–10.5)

## 2017-10-06 MED ORDER — COLLAGENASE 250 UNIT/GM EX OINT
TOPICAL_OINTMENT | Freq: Every day | CUTANEOUS | Status: DC
  Administered 2017-10-06: 1 via TOPICAL
  Filled 2017-10-06: qty 90

## 2017-10-06 MED ORDER — SODIUM CHLORIDE 0.9 % IV SOLN
INTRAVENOUS | Status: DC
  Administered 2017-10-06 (×2): via INTRAVENOUS

## 2017-10-06 NOTE — Progress Notes (Signed)
BP 80/53, pt conversing with friend at bedside. Asymptomatic. Dr Cruzita Lederer paged to make aware. Will monitor for new orders.

## 2017-10-06 NOTE — Progress Notes (Signed)
PROGRESS NOTE  Sarah Liu MEQ:[id number] DOB: [birth year] DOA: 10/02/2017 PCP: Lilian Coma., MD   LOS: 3 days   Brief Narrative / Interim history: 72 year old female with type 2 diabetes mellitus, peripheral vascular disease status post left AKA in 2018, unstageable sacral decubitus, DVT on Coumadin, coronary artery disease, hypothyroidism, diabetes mellitus, prior pancreatic cancer, was being brought to the hospital due to poor appetite/generalized weakness.  She was found to have acute renal failure and was admitted to the hospital.  Assessment & Plan: Principal Problem:   ARF (acute renal failure) (HCC) Active Problems:   DM (diabetes mellitus), type 2 with renal complications (HCC)   PVD (peripheral vascular disease) (HCC)   CAD (coronary artery disease)   Essential hypertension   Protein-calorie malnutrition, severe   Pressure injury of skin   Acute kidney injury -Likely in the setting of poor p.o. intake, patient received IV fluids and creatinine has now normalized -Stop IV fluids today, repeat renal function in the morning  History of pancreatic cancer -Unclear as to history regarding  disease, she recently had a CT scan of the abdomen and pelvis just couple months ago at [facility] and there was without acute findings -Patient tells me that she was diagnosed with pancreatic cancer about 13 years ago, does not know exactly how she was treated but tells me this is all resolved  Failure to thrive -Patient states that she does not like the food in the SNF, tells me that the family is to bring her food from what they called the restaurant she would have no problem eating that.  She denies any abdominal pain, nausea or vomiting. -Able to eat better last night after family brought her [employer] -PT evaluation pending, discussed with Education officer, museum, plan to have her go back to SNF tomorrow  Type 2 diabetes mellitus -Continue sliding scale, CBG is today between 90s and  160s, well controlled, will make no changes to current regimen today  Peripheral vascular disease -Status post AKA, she has follow-up with vascular surgery at [facility] in the next couple weeks  Coronary artery disease -No chest pain, continue beta-blockers  Urinary tract infection -Urine cultures with MRSA as well as enterococcus, based on sensitivities will narrow to doxycycline & nitrofurantoin.  Patient without significant dysuria / frequency for UTI, no fevers or chills or systemic symptoms, perhaps contaminant but will need to treat further the sacral ulcer anyway  Unstageable sacral decubitus ulcer -Wound care consulted, patient needs hydrotherapy, will cover with doxycycline as above for MRSA   DVT prophylaxis: Lovneox Code Status: Full code Family Communication: Discussed with niece Crystal last night as well as Crystal's husband in presence of the patient at bedside this morning  Disposition Plan: SNF tomorrow  Consultants:   None   Procedures:   None   Antimicrobials:  Ceftriaxone 2/3 >> 2/6  Vancomycin x 1 2/6  Doxycycline 2/6 >>  Nitrofurantoin 2/6 >>  Subjective: -Denies any chest pain or shortness of breath, no abdominal pain, nausea or vomiting.  Overall she feels well.   Objective: Vitals:   10/06/17 0501 10/06/17 0820 10/06/17 1053 10/06/17 1439  BP: 103/64 109/73 (!) 84/55 100/61  Pulse: 88 (!) 103 (!) 102 (!) 109  Resp: 18     Temp: 98.3 F (36.8 C)  98.5 F (36.9 C) 98.9 F (37.2 C)  TempSrc: Oral  Oral Oral  SpO2: 100% 100% 100% 100%  Weight: 50.5 kg (111 lb 6.4 oz)     Height:  Intake/Output Summary (Last 24 hours) at 10/06/2017 1519 Last data filed at 10/06/2017 1400 Gross per 24 hour  Intake 1852.5 ml  Output 1450 ml  Net 402.5 ml   Filed Weights   10/04/17 1106 10/05/17 0408 10/06/17 0501  Weight: 47.3 kg (104 lb 4.8 oz) 46.9 kg (103 lb 6.4 oz) 50.5 kg (111 lb 6.4 oz)    Examination:  Constitutional: No  distress Eyes: No scleral icterus ENMT: Moist mucous membranes Respiratory: Clear to auscultation bilaterally, no wheezing, no crackles Cardiovascular: Regular rate and rhythm without murmurs rubs or gallops Abdomen: Soft, nontender, nondistended, bowel sounds positive Skin: Pressure injury of the sacrum with dressing intact Neurologic: No focal findings Psychiatric: Alert and oriented x3   Data Reviewed: I have independently reviewed following labs and imaging studies   CBC: Recent Labs  Lab 10/02/17 1725 10/04/17 0316 10/05/17 1120 10/06/17 0640  WBC 8.3 6.5 6.0 5.2  NEUTROABS 6.5  --   --   --   HGB 12.6 8.1* 7.4* 8.0*  HCT 39.3 25.5* 23.2* 24.7*  MCV 92.5 92.7 92.8 93.6  PLT 606* 429* 367 409   Basic Metabolic Panel: Recent Labs  Lab 10/02/17 1725 10/03/17 0857 10/04/17 0316 10/05/17 0306 10/06/17 0640  NA 135 137 137 136 137  K 4.8 4.8 4.5 4.3 4.8  CL 105 108 110 117* 114*  CO2 21* 19* 22 16* 20*  GLUCOSE 201* 120* 89 153* 165*  BUN 55* 48* 38* 25* 20  CREATININE 1.79* 1.41* 1.10* 0.81 0.87  CALCIUM 9.0 8.4* 8.2* 6.8* 8.0*   GFR: Estimated Creatinine Clearance: 48.7 mL/min (by C-G formula based on SCr of 0.87 mg/dL). Liver Function Tests: Recent Labs  Lab 10/02/17 1725 10/04/17 0316 10/05/17 0306  AST 78* 51* 59*  ALT 31 27 26   ALKPHOS 386* 281* 224*  BILITOT 0.3 0.2* 0.2*  PROT 7.1 5.3* 3.9*  ALBUMIN 2.0* 1.5* 1.1*   No results for input(s): LIPASE, AMYLASE in the last 168 hours. No results for input(s): AMMONIA in the last 168 hours. Coagulation Profile: Recent Labs  Lab 10/02/17 1725  INR 1.24   Cardiac Enzymes: No results for input(s): CKTOTAL, CKMB, CKMBINDEX, TROPONINI in the last 168 hours. BNP (last 3 results) No results for input(s): PROBNP in the last 8760 hours. HbA1C: No results for input(s): HGBA1C in the last 72 hours. CBG: Recent Labs  Lab 10/05/17 1646 10/05/17 2111 10/06/17 0731 10/06/17 1117 10/06/17 1157  GLUCAP  86 211* 162* 69 96   Lipid Profile: No results for input(s): CHOL, HDL, LDLCALC, TRIG, CHOLHDL, LDLDIRECT in the last 72 hours. Thyroid Function Tests: No results for input(s): TSH, T4TOTAL, FREET4, T3FREE, THYROIDAB in the last 72 hours. Anemia Panel: No results for input(s): VITAMINB12, FOLATE, FERRITIN, TIBC, IRON, RETICCTPCT in the last 72 hours. Urine analysis:    Component Value Date/Time   COLORURINE AMBER (A) 10/02/2017 1649   APPEARANCEUR CLOUDY (A) 10/02/2017 1649   LABSPEC 1.023 10/02/2017 1649   PHURINE 5.0 10/02/2017 1649   GLUCOSEU NEGATIVE 10/02/2017 1649   HGBUR SMALL (A) 10/02/2017 1649   BILIRUBINUR NEGATIVE 10/02/2017 1649   KETONESUR NEGATIVE 10/02/2017 1649   PROTEINUR NEGATIVE 10/02/2017 1649   UROBILINOGEN 0.2 11/06/2013 1426   NITRITE NEGATIVE 10/02/2017 1649   LEUKOCYTESUR LARGE (A) 10/02/2017 1649   Sepsis Labs: Invalid input(s): PROCALCITONIN, LACTICIDVEN  Recent Results (from the past 240 hour(s))  Urine Culture     Status: Abnormal   Collection Time: 10/02/17  4:16 PM  Result Value  Ref Range Status   Specimen Description   Final    URINE, RANDOM Performed at [facility] [address], [place], [place] [address]    Special Requests   Final    NONE Performed at [facility], [place] [address], [place], [place] [address]    Culture (A)  Final    60,000 COLONIES/mL STAPHYLOCOCCUS AUREUS 60,000 COLONIES/mL ENTEROCOCCUS FAECALIS    Report Status 10/05/2017 FINAL  Final   Organism ID, Bacteria STAPHYLOCOCCUS AUREUS (A)  Final   Organism ID, Bacteria ENTEROCOCCUS FAECALIS (A)  Final      Susceptibility   Enterococcus faecalis - MIC*    AMPICILLIN <=2 SENSITIVE Sensitive     LEVOFLOXACIN >=8 RESISTANT Resistant     NITROFURANTOIN <=16 SENSITIVE Sensitive     VANCOMYCIN 1 SENSITIVE Sensitive     * 60,000 COLONIES/mL ENTEROCOCCUS FAECALIS   Staphylococcus aureus - MIC*    CIPROFLOXACIN >=8 RESISTANT  Resistant     GENTAMICIN <=0.5 SENSITIVE Sensitive     NITROFURANTOIN <=16 SENSITIVE Sensitive     OXACILLIN >=4 RESISTANT Resistant     TETRACYCLINE <=1 SENSITIVE Sensitive     VANCOMYCIN <=0.5 SENSITIVE Sensitive     TRIMETH/SULFA 80 RESISTANT Resistant     CLINDAMYCIN >=8 RESISTANT Resistant     RIFAMPIN <=0.5 SENSITIVE Sensitive     Inducible Clindamycin NEGATIVE Sensitive     * 60,000 COLONIES/mL STAPHYLOCOCCUS AUREUS  MRSA PCR Screening     Status: Abnormal   Collection Time: 10/03/17  6:44 PM  Result Value Ref Range Status   MRSA by PCR POSITIVE (A) NEGATIVE Final    Comment:        The GeneXpert MRSA Assay (FDA approved for NASAL specimens only), is one component of a comprehensive MRSA colonization surveillance program. It is not intended to diagnose MRSA infection nor to guide or monitor treatment for MRSA infections. RESULT CALLED TO, READ BACK BY AND VERIFIED WITH: Artis Delay RN 0122 10/04/17 A NAVARRO Performed at [facility], [place] [address], [place], [place] [address]       Radiology Studies: No results found.   Scheduled Meds: . acidophilus  1 capsule Oral Daily  . aspirin EC  81 mg Oral Daily  . atorvastatin  40 mg Oral Daily  . calcitRIOL  0.5 mcg Oral Daily  . carvedilol  6.25 mg Oral BID WC  . Chlorhexidine Gluconate Cloth  6 each Topical Q0600  . collagenase   Topical Daily  . collagenase   Topical Daily  . enoxaparin (LOVENOX) injection  30 mg Subcutaneous QHS  . feeding supplement (ENSURE ENLIVE)  237 mL Oral BID BM  . feeding supplement (GLUCERNA SHAKE)  237 mL Oral BID BM  . FLUoxetine  20 mg Oral Daily  . gabapentin  100 mg Oral TID  . insulin aspart  0-9 Units Subcutaneous TID WC  . iron polysaccharides  150 mg Oral Daily  . magnesium oxide  400 mg Oral BID  . megestrol  400 mg Oral BID  . mirtazapine  15 mg Oral QHS  . modafinil  100 mg Oral Daily  . mupirocin ointment  1 application Nasal BID  . nitrofurantoin  (macrocrystal-monohydrate)  100 mg Oral Q12H  . pantoprazole  40 mg Oral Daily  . polyethylene glycol  17 g Oral Daily   Continuous Infusions: . sodium chloride 75 mL/hr at 10/06/17 0515  . doxycycline (VIBRAMYCIN) IV Stopped (10/06/17 0715)     Darryl Blumenstein  Cruzita Lederer, MD, PhD [facility] Pager [phone]  If 7PM-7AM, please contact night-coverage [email or web address] Password [facility] 10/06/2017, 3:19 PM

## 2017-10-06 NOTE — Care Management Important Message (Signed)
Important Message  Patient Details  Name: Aubrina Nieman MRN: [id number] Date of Birth: 1945-09-06   Medicare Important Message Given:  Yes    Kerin Salen 10/06/2017, 11:00 AMImportant Message  Patient Details  Name: Taleeyah Bora MRN: [id number] Date of Birth: 1946/07/21   Medicare Important Message Given:  Yes    Kerin Salen 10/06/2017, 11:00 AM

## 2017-10-06 NOTE — Plan of Care (Signed)
  Progressing Health Behavior/Discharge Planning: Ability to manage health-related needs will improve 10/06/2017 0938 - Progressing by Kerrin Mo, RN Clinical Measurements: Ability to maintain clinical measurements within normal limits will improve 10/06/2017 0938 - Progressing by Kerrin Mo, RN Will remain free from infection 10/06/2017 4097 - Progressing by Kerrin Mo, RN Note IV abx Diagnostic test results will improve 10/06/2017 0938 - Progressing by Kerrin Mo, RN Activity: Risk for activity intolerance will decrease 10/06/2017 0938 - Progressing by Kerrin Mo, RN Nutrition: Adequate nutrition will be maintained 10/06/2017 3532 - Progressing by Kerrin Mo, RN Pain Managment: General experience of comfort will improve 10/06/2017 0938 - Progressing by Kerrin Mo, RN Skin Integrity: Risk for impaired skin integrity will decrease 10/06/2017 0938 - Progressing by Kerrin Mo, RN

## 2017-10-06 NOTE — NC FL2 (Signed)
[place] LEVEL OF CARE SCREENING TOOL     IDENTIFICATION  Patient Name: Sarah Liu Birthdate: 1946/05/22 Sex: female Admission Date (Current Location): 10/02/2017  [facility] and [place] Number:  Herbalist and Address:  [facility],  [place] [address], [place]      Provider Number: [id number]  Attending Physician Name and Address:  Caren Griffins, MD  Relative Name and Phone Number:       Current Level of Care: Hospital Recommended Level of Care: [place] Prior Approval Number:    Date Approved/Denied:   PASRR Number: [id number]  Discharge Plan: SNF    Current Diagnoses: Patient Active Problem List   Diagnosis Date Noted  . Protein-calorie malnutrition, severe 10/04/2017  . Pressure injury of skin 10/04/2017  . ARF (acute renal failure) ([place]) 10/02/2017  . PVD (peripheral vascular disease) ([place]) 10/02/2017  . CAD (coronary artery disease) 10/02/2017  . Essential hypertension 10/02/2017  . Hyperkalemia 11/06/2013  . AKI (acute kidney injury) ([place]) 11/06/2013  . DM (diabetes mellitus), type 2 with renal complications ([place]) 06/15/5101  . Unspecified hypothyroidism 11/06/2013    Orientation RESPIRATION BLADDER Height & Weight     Self, Time, Situation, Place  Normal Incontinent, Indwelling catheter Weight: 111 lb 6.4 oz (50.5 kg) Height:  5\' 6"  (167.6 cm)  BEHAVIORAL SYMPTOMS/MOOD NEUROLOGICAL BOWEL NUTRITION STATUS      Continent Diet(regular diet)  AMBULATORY STATUS COMMUNICATION OF NEEDS Skin   Extensive Assist Verbally PU Stage and Appropriate Care, Hydro Therapy   Wound type:Unstageable to sacrum and right buttock, present on admission Measurement:6cm x 7cm x 2cm deep with 2cm undermining from 8 o'clock to 4 o'clock, serosanguinous no odor noted  Wound bed: sacrum 25% black, 65% pink, 10% white fibrous tissue, surrounding skin intact. Right heel 0.5cm x 3cm open crack in dried heel, bleeding  slightly, periwound dry, calloused.  Right lateral malleolus has 2cm x 1.5cm x 0.1cm unstageable with 100% yellow slough, scant serous exudate, no odor. Drainage (amount, consistency, odor)see above Periwound: see above Dressing procedure/placement/frequency:I have coordinated my visit today with PT for hydrotherapy so I can assess wound status to ensure current wound care orders are still appropriate. Wound is mostly clean except for a black necrotic area on left side of wound. Continue with plan of hydrotherapy for sacrum, will add orders for care for two wounds on right foot. Will order low air loss mattress as well as Prevalon boot for right foot, pt has a left AKA.                        Personal Care Assistance Level of Assistance  Bathing, Feeding, Dressing Bathing Assistance: Limited assistance Feeding assistance: Independent Dressing Assistance: Limited assistance     Functional Limitations Info  Sight, Hearing, Speech Sight Info: Adequate Hearing Info: Adequate Speech Info: Adequate    SPECIAL CARE FACTORS FREQUENCY  PT (By licensed PT), OT (By licensed OT)     PT Frequency: 5x OT Frequency: 5x            Contractures Contractures Info: Not present    Additional Factors Info  Code Status, Allergies, Isolation Precautions Code Status Info: full code Allergies Info: latex, penicillins     Isolation Precautions Info: contact precautions-MRSA     Current Medications (10/06/2017):  This is the current hospital active medication list Current Facility-Administered Medications  Medication Dose Route Frequency Provider Last Rate Last Dose  . acetaminophen (  TYLENOL) tablet 650 mg  650 mg Oral Q6H PRN Rise Patience, MD   650 mg at 10/04/17 1046   Or  . acetaminophen (TYLENOL) suppository 650 mg  650 mg Rectal Q6H PRN Rise Patience, MD      . acidophilus (RISAQUAD) capsule 1 capsule  1 capsule Oral Daily Rise Patience, MD   1 capsule at 10/06/17  1104  . aspirin EC tablet 81 mg  81 mg Oral Daily Rise Patience, MD   81 mg at 10/06/17 1106  . atorvastatin (LIPITOR) tablet 40 mg  40 mg Oral Daily Rise Patience, MD   40 mg at 10/06/17 1105  . calcitRIOL (ROCALTROL) capsule 0.5 mcg  0.5 mcg Oral Daily Rise Patience, MD   0.5 mcg at 10/06/17 1104  . carvedilol (COREG) tablet 6.25 mg  6.25 mg Oral BID WC Rise Patience, MD   6.25 mg at 10/06/17 1610  . Chlorhexidine Gluconate Cloth 2 % PADS 6 each  6 each Topical Q0600 Donne Hazel, MD   6 each at 10/06/17 0600  . collagenase (SANTYL) ointment   Topical Daily Donne Hazel, MD      . collagenase (SANTYL) ointment   Topical Daily Caren Griffins, MD   1 application at 96/04/54 1236  . doxycycline (VIBRAMYCIN) 100 mg in dextrose 5 % 250 mL IVPB  100 mg Intravenous Q12H Caren Griffins, MD   Stopped at 10/06/17 0715  . enoxaparin (LOVENOX) injection 30 mg  30 mg Subcutaneous QHS Rise Patience, MD   30 mg at 10/05/17 2110  . feeding supplement (ENSURE ENLIVE) (ENSURE ENLIVE) liquid 237 mL  237 mL Oral BID BM Donne Hazel, MD   237 mL at 10/05/17 0934  . feeding supplement (GLUCERNA SHAKE) (GLUCERNA SHAKE) liquid 237 mL  237 mL Oral BID BM Donne Hazel, MD   237 mL at 10/06/17 1102  . FLUoxetine (PROZAC) capsule 20 mg  20 mg Oral Daily Rise Patience, MD   20 mg at 10/06/17 1105  . gabapentin (NEURONTIN) capsule 100 mg  100 mg Oral TID Rise Patience, MD   100 mg at 10/06/17 1550  . hydrALAZINE (APRESOLINE) injection 10 mg  10 mg Intravenous Q4H PRN Rise Patience, MD      . insulin aspart (novoLOG) injection 0-9 Units  0-9 Units Subcutaneous TID WC Rise Patience, MD   2 Units at 10/06/17 [phone]  . iron polysaccharides (NIFEREX) capsule 150 mg  150 mg Oral Daily Rise Patience, MD   150 mg at 10/06/17 1106  . magnesium oxide (MAG-OX) tablet 400 mg  400 mg Oral BID Rise Patience, MD   400 mg at 10/06/17 1105  .  megestrol (MEGACE) 400 MG/10ML suspension 400 mg  400 mg Oral BID Donne Hazel, MD   400 mg at 10/06/17 1104  . mirtazapine (REMERON) tablet 15 mg  15 mg Oral QHS Rise Patience, MD   15 mg at 10/05/17 2113  . modafinil (PROVIGIL) tablet 100 mg  100 mg Oral Daily Rise Patience, MD   100 mg at 10/06/17 1103  . mupirocin ointment (BACTROBAN) 2 % 1 application  1 application Nasal BID Donne Hazel, MD   1 application at 19/14/78 1110  . nitrofurantoin (macrocrystal-monohydrate) (MACROBID) capsule 100 mg  100 mg Oral Q12H Caren Griffins, MD   100 mg at 10/06/17 1105  . ondansetron (ZOFRAN) tablet 4 mg  4 mg Oral Q6H PRN Rise Patience, MD       Or  . ondansetron [facility]) injection 4 mg  4 mg Intravenous Q6H PRN Rise Patience, MD      . oxyCODONE (Oxy IR/ROXICODONE) immediate release tablet 5 mg  5 mg Oral Q4H PRN Rise Patience, MD   5 mg at 10/06/17 1550  . pantoprazole (PROTONIX) EC tablet 40 mg  40 mg Oral Daily Rise Patience, MD   40 mg at 10/06/17 1104  . polyethylene glycol (MIRALAX / GLYCOLAX) packet 17 g  17 g Oral Daily Donne Hazel, MD   17 g at 10/06/17 1102  . sodium chloride flush (NS) 0.9 % injection 10-40 mL  10-40 mL Intracatheter PRN Donne Hazel, MD   10 mL at 10/06/17 6468     Discharge Medications: Please see discharge summary for a list of discharge medications.  Relevant Imaging Results:  Relevant Lab Results:   Additional Information SS# [id number]  Nila Nephew, LCSW

## 2017-10-06 NOTE — Progress Notes (Signed)
HYDROTHERAPY TREATMENT  Wound is progressing. Pt may be able to return to SNF with nursing managing dressing changes/wound care. Hydrotherapy may not be warranted for much longer. Will continue to follow during hospital stay.    10/06/17 1236  Subjective Assessment  Subjective "okay. I know we have to do it"  Patient and Family Stated Goals none stated  Date of Onset (unknown POA)  Evaluation and Treatment  Evaluation and Treatment Procedures Explained to Patient/Family Yes  Evaluation and Treatment Procedures agreed to  Pressure Injury 10/03/17 Unstageable - Full thickness tissue loss in which the base of the ulcer is covered by slough (yellow, tan, gray, green or brown) and/or eschar (tan, brown or black) in the wound bed. *PT* Hydrotherapy. Sacral and onto R buttocks.  Date First Assessed/Time First Assessed: 10/03/17 1320   Location: Sacrum  Staging: Unstageable - Full thickness tissue loss in which the base of the ulcer is covered by slough (yellow, tan, gray, green or brown) and/or eschar (tan, brown or black) in...  Dressing Type ABD;Barrier Filmskin prep;Gauze  Writer)  Dressing Changed  Dressing Change Frequency Daily  State of Healing Early/partial granulation  Site / Wound Assessment Pink;Yellow;Dark Red (fibrous tissue. Also,area that looks like clotted blood)  % Wound base Red or Granulating 75%  % Wound base Yellow/Fibrinous Exudate 15%  % Wound base Other/Granulation Tissue (Comment) 10% (what looks like clotted blood)  Peri-wound Assessment Pink;Excoriated  Margins Unattached edges (unapproximated)  Drainage Amount Moderate  Drainage Description Serosanguineous  Treatment Debridement (Selective);Hydrotherapy (Pulse lavage);Packing (Saline gauze) (Enzymatic debridement)  Hydrotherapy  Pulsed lavage therapy - wound location sacral  Pulsed Lavage with Suction (psi) 8 psi  Pulsed Lavage with Suction - Normal Saline Used 1000 mL  Pulsed Lavage Tip Tip with splash  shield  Selective Debridement  Selective Debridement - Location sacrum  Selective Debridement - Tools Used Forceps;Scissors  Selective Debridement - Tissue Removed yellow slough  Wound Therapy - Assess/Plan/Recommendations  Wound Therapy - Clinical Statement 72 yo female with history of L AKA, DM, PVD, pancreatic cancer, DVT, CHF, CAD admitted from SNF with ARF and sacral decubitus.   Wound Therapy - Functional Problem List Limited mobility due to L AKA. Difficulty repositining in bed.   Factors Delaying/Impairing Wound Healing Diabetes Mellitus;Incontinence;Multiple medical problems  Hydrotherapy Plan Debridement;Dressing change;Patient/family education;Pulsatile lavage with suction  Wound Therapy - Frequency 6X / week  Wound Therapy - Current Recommendations Case manager/social work  Wound Therapy - Follow Up Recommendations Skilled nursing facility  Wound Plan Hydrotherapy, dressing changes, enzymatic debridment, and off-loading/repositioning to facilitate wound healing  Wound Therapy Goals - Improve the function of patient's integumentary system by progressing the wound(s) through the phases of wound healing by:  Decrease Necrotic Tissue to 10%  Decrease Necrotic Tissue - Progress Progressing toward goal  Increase Granulation Tissue to 90%  Increase Granulation Tissue - Progress Progressing toward goal  Goals/treatment plan/discharge plan were made with and agreed upon by patient/family Yes  Time For Goal Achievement 2 weeks  Wound Therapy - Potential for Goals Good    Weston Anna, MPT [phone]

## 2017-10-06 NOTE — Evaluation (Signed)
Physical Therapy Evaluation Patient Details Name: Sarah Liu MRN: [id number] DOB: [birth year] Today's Date: 10/06/2017   History of Present Illness  72 yo female admitted with ARF, sacral decubitus. Hx of L AKA, DM, multiple wounds. Pt is from a SNF.   Clinical Impression  On eval, pt required Mod assist for rolling, Mod assist +2 for bed mobility. She sat EOB for 3-4 minutes with Mod assist for static sitting balance. Pt presents with general weakness, decreased activity tolerance, and impaired balance. Recommend return to SNF for continued therapy as able with sacral wound. Will follow and progress activity as tolerated.     Follow Up Recommendations SNF    Equipment Recommendations  None recommended by PT    Recommendations for Other Services       Precautions / Restrictions Precautions Precautions: Fall Precaution Comments: L AKA Restrictions Weight Bearing Restrictions: No      Mobility  Bed Mobility Overal bed mobility: Needs Assistance Bed Mobility: Rolling;Sidelying to Sit;Sit to Supine Rolling: Mod assist Sidelying to sit: Mod assist;+2 for physical assistance;+2 for safety/equipment   Sit to supine: Mod assist;+2 for safety/equipment;+2 for physical assistance;HOB elevated   General bed mobility comments: Assist for trunk and LE/residual limb. Utilized bedpad for scooting, positioning. Increased time. Sat EOB for ~3-4 minutes with Mod assist for sitting balance  Transfers                 General transfer comment: NT-pt unable to tolerate scoot/lateral transfer with wound on sacral area/bottom.  Ambulation/Gait                Stairs            Wheelchair Mobility    Modified Rankin (Stroke Patients Only)       Balance Overall balance assessment: Needs assistance Sitting-balance support: Single extremity supported;Bilateral upper extremity supported Sitting balance-Leahy Scale: Poor Sitting balance - Comments: Mod assist for  static sitting balance. Pt leaning posteriorly and using L elbow to prop agains bed surface Postural control: Posterior lean;Left lateral lean                                   Pertinent Vitals/Pain Pain Assessment: 0-10 Pain Score: 7  Pain Location: sacral wound area Pain Descriptors / Indicators: Discomfort;Sore;Guarding;Grimacing Pain Intervention(s): Limited activity within patient's tolerance;Repositioned    Home Living Family/patient expects to be discharged to:: Skilled nursing facility               Home Equipment: Wheelchair - manual      Prior Function Level of Independence: Needs assistance   Gait / Transfers Assistance Needed: nonambulatory. Transfers to wheelchair with assistance.   ADL's / Homemaking Assistance Needed: Assist needed for ADLs.  Comments: Per pt, she has been working with therapy at [facility]     Hand Dominance        Extremity/Trunk Assessment   Upper Extremity Assessment Upper Extremity Assessment: Generalized weakness    Lower Extremity Assessment Lower Extremity Assessment: Generalized weakness    Cervical / Trunk Assessment Cervical / Trunk Assessment: Kyphotic  Communication      Cognition Arousal/Alertness: Awake/alert Behavior During Therapy: WFL for tasks assessed/performed Overall Cognitive Status: Within Functional Limits for tasks assessed  General Comments      Exercises     Assessment/Plan    PT Assessment Patient needs continued PT services  PT Problem List Decreased strength;Decreased balance;Pain;Decreased range of motion;Decreased mobility;Decreased activity tolerance;Decreased skin integrity;Decreased knowledge of use of DME       PT Treatment Interventions DME instruction;Functional mobility training;Therapeutic activities;Patient/family education;Balance training;Therapeutic exercise    PT Goals (Current goals can be found in the Care  Plan section)  Acute Rehab PT Goals Patient Stated Goal: to get stronger. for wound to heal PT Goal Formulation: With patient Time For Goal Achievement: 10/20/17 Potential to Achieve Goals: Fair    Frequency Min 2X/week   Barriers to discharge        Co-evaluation               AM-PAC PT "6 Clicks" Daily Activity  Outcome Measure Difficulty turning over in bed (including adjusting bedclothes, sheets and blankets)?: Unable Difficulty moving from lying on back to sitting on the side of the bed? : Unable Difficulty sitting down on and standing up from a chair with arms (e.g., wheelchair, bedside commode, etc,.)?: Unable Help needed moving to and from a bed to chair (including a wheelchair)?: Total Help needed walking in hospital room?: Total Help needed climbing 3-5 steps with a railing? : Total 6 Click Score: 6    End of Session   Activity Tolerance: Patient limited by pain;Patient limited by fatigue Patient left: in bed;with call bell/phone within reach;with bed alarm set   PT Visit Diagnosis: Muscle weakness (generalized) (M62.81);Pain;Other abnormalities of gait and mobility (R26.89) Pain - Right/Left: (sacral/buttocks area)    Time: 6433-2951 PT Time Calculation (min) (ACUTE ONLY): 10 min   Charges:   PT Evaluation $PT Eval Moderate Complexity: 1 Mod     PT G Codes:          Weston Anna, MPT Pager: [phone]

## 2017-10-06 NOTE — Consult Note (Addendum)
[facility] Nurse wound follow up Wound type:Unstageable to sacrum and right buttock, present on admission Measurement:6cm x 7cm x 2cm deep with 2cm undermining from 8 o'clock to 4 o'clock, serosanguinous no odor noted  Wound bed: sacrum 25% black, 65% pink, 10% white fibrous tissue, surrounding skin intact. Right heel 0.5cm x 3cm open crack in dried heel, bleeding slightly, periwound dry, calloused.  Right lateral malleolus has 2cm x 1.5cm x 0.1cm unstageable with 100% yellow slough, scant serous exudate, no odor. Drainage (amount, consistency, odor)see above Periwound: see above Dressing procedure/placement/frequency:I have coordinated my visit today with PT for hydrotherapy so I can assess wound status to ensure current wound care orders are still appropriate. Wound is mostly clean except for a black necrotic area on left side of wound. Continue with plan of hydrotherapy for sacrum, will add orders for care for two wounds on right foot. Will order low air loss mattress as well as Prevalon boot for right foot, pt has a left AKA. Patient is malnourished as evidenced by lab results, recommend Nutritional Consult by RD to assist with wound healing, please order if you agree. We will follow this patient weekly and remain available to this patient, nursing, and the medical and surgical teams.  Please re-consult if we need to assist further before our next visit.   Fara Olden, RN-C, WTA-C, OCA Wound Treatment Associate Ostomy Care Associate  Bedside RN informed me the plan is for pt to return to nursing home tomorrow. Will dc order for air mattress. Same nurse, back tomorrow and if plans change will call Maudie Flakes for order.

## 2017-10-06 NOTE — Progress Notes (Signed)
Pt BP noted to be 84/50 on routine check. Pt drowsy (recently received 5mg  Oxy IR) but easily arousable and oriented at baseline. Dr Cruzita Lederer aware. Requests recheck in an hour.

## 2017-10-06 NOTE — Progress Notes (Addendum)
CSW following to assist with disposition. Pt planning to return to her SNF [facility]) at [place] with facility director yesterday who advised pt will move to an alternate hall to attempt alleviating her dissatisfaction with current hall caretakers. Pt agreeable. Pt is at [facility] under [place], meaning she does not have current payor source and [place] office is assisting with Medicaid application with good-faith that pt will obtain medicaid and facility be reimbursed for her care. Subsequently, it is not practical option for pt to move to another SNF at this point as pt does not have payor source. Pt understanding. CSW initiated [employer] authorization attempting to have pt return under Medicare benefit for hydrotherapy needs. If not authorized, will return under [place]. Per facility, pt has balance owed to facility that must be paid prior to re-admission. Pt agreed to call facility business office today to work this out.  Will follow and assist with transition back to SNF at [place].  Sharren Bridge, MSW, LCSW Clinical Social Work 10/06/2017 [phone]  16:24- Pt has agreed to work with facility re: her balanced owed.  As she does not have medicaid currently, facility considering her as private pay resident. Upon hearing this pt states she realizes she needs to enlist help to finish Medicaid application as she does not have any financial resources to private pay.  Pt's niece Crystal advises CSW and facility that she is no longer willing to be involved and assist pt with her needs. Upon hearing this pt's friend/POA Collie Siad states she is now willing to take over as point person and assist with pt's Medicaid application. Pt agreeable. Facility agreeable to accept pt to return as [place] resident.   CSW completed FL2 preparing for pt's return to SNF at [place] tomorrow. Auth request is pending.

## 2017-10-06 NOTE — Progress Notes (Signed)
Hypoglycemic Event  CBG: 69  Treatment: juice  Symptoms: none  Follow-up CBG: Time:1157 CBG Result: 96  Possible Reasons for Event: Poor PO intake.   Comments/MD notified:Pt family bringing in [place] for lunch.     Jola Schmidt

## 2017-10-06 NOTE — Progress Notes (Signed)
BP remains soft at 81/48.  Pt awake, reading her menu, conversing with staff. Dr Cruzita Lederer made aware. New orders for maintence fluids received.

## 2017-10-07 DIAGNOSIS — Z794 Long term (current) use of insulin: Secondary | ICD-10-CM

## 2017-10-07 DIAGNOSIS — E1129 Type 2 diabetes mellitus with other diabetic kidney complication: Secondary | ICD-10-CM

## 2017-10-07 DIAGNOSIS — E43 Unspecified severe protein-calorie malnutrition: Secondary | ICD-10-CM

## 2017-10-07 DIAGNOSIS — L89300 Pressure ulcer of unspecified buttock, unstageable: Secondary | ICD-10-CM

## 2017-10-07 LAB — GLUCOSE, CAPILLARY
Glucose-Capillary: 152 mg/dL — ABNORMAL HIGH (ref 65–99)
Glucose-Capillary: 101 mg/dL — ABNORMAL HIGH (ref 65–99)

## 2017-10-07 MED ORDER — OXYCODONE HCL 5 MG PO TABS: 5.0000 mg | ORAL_TABLET | ORAL | 0 refills | Status: AC | PRN

## 2017-10-07 MED ORDER — INSULIN GLARGINE 100 UNIT/ML ~~LOC~~ SOLN: 10.0000 [IU] | Freq: Every day | SUBCUTANEOUS | Status: AC

## 2017-10-07 MED ORDER — NITROFURANTOIN MONOHYD MACRO 100 MG PO CAPS: 100.0000 mg | ORAL_CAPSULE | Freq: Two times a day (BID) | ORAL | 0 refills | Status: AC

## 2017-10-07 MED ORDER — DOXYCYCLINE HYCLATE 100 MG PO TBEC: 100.0000 mg | DELAYED_RELEASE_TABLET | Freq: Two times a day (BID) | ORAL | 0 refills | Status: AC

## 2017-10-07 MED ORDER — FUROSEMIDE 40 MG PO TABS: 20.0000 mg | ORAL_TABLET | Freq: Every day | ORAL | Status: AC | PRN

## 2017-10-07 MED ORDER — CARVEDILOL 6.25 MG PO TABS: 3.1250 mg | ORAL_TABLET | Freq: Two times a day (BID) | ORAL | 0 refills | Status: AC

## 2017-10-07 NOTE — Progress Notes (Signed)
HYDROTHERAPY TREATMENT     10/07/17 1100  Subjective Assessment  Subjective "okay"  Patient and Family Stated Goals none stated  Date of Onset (unknown-POA)  Evaluation and Treatment  Evaluation and Treatment Procedures Explained to Patient/Family Yes  Evaluation and Treatment Procedures agreed to  Pressure Injury 10/03/17 Unstageable - Full thickness tissue loss in which the base of the ulcer is covered by slough (yellow, tan, gray, green or brown) and/or eschar (tan, brown or black) in the wound bed. *PT* Hydrotherapy. Sacral and onto R buttocks.  Date First Assessed/Time First Assessed: 10/03/17 1320   Location: Sacrum  Staging: Unstageable - Full thickness tissue loss in which the base of the ulcer is covered by slough (yellow, tan, gray, green or brown) and/or eschar (tan, brown or black) in...  Dressing Type ABD;Barrier Film (skin prep);Gauze (Comment) (Santyl)  Dressing Changed  Dressing Change Frequency Daily  State of Healing Early/partial granulation  Site / Wound Assessment Pink;Red;Yellow;Painful (Dark Red-possibly blood clotting; fibrous tissue)  % Wound base Red or Granulating 75%  % Wound base Yellow/Fibrinous Exudate 15%  % Wound base Other/Granulation Tissue (Comment) 10% (dark red possibly blood clotting)  Peri-wound Assessment Pink;Excoriated  Margins Unattached edges (unapproximated)  Drainage Amount Moderate  Drainage Description Serosanguineous  Treatment Debridement (Selective);Hydrotherapy (Pulse lavage);Packing (Saline gauze) (Enzymatic debridement)  Hydrotherapy  Pulsed lavage therapy - wound location sacral  Pulsed Lavage with Suction (psi) 8 psi  Pulsed Lavage with Suction - Normal Saline Used 1000 mL  Pulsed Lavage Tip Tip with splash shield  Selective Debridement  Selective Debridement - Location sacrum  Selective Debridement - Tools Used Forceps;Scissors  Selective Debridement - Tissue Removed (none today. appears to be fibrous tissue with minimal  layer/amount of slough)  Wound Therapy - Assess/Plan/Recommendations  Wound Therapy - Clinical Statement 72 yo female with history of L AKA, DM, PVD, pancreatic cancer, DVT, CHF, CAD admitted from SNF with ARF and sacral decubitus.   Wound Therapy - Functional Problem List Limited mobility due to L AKA. Difficulty repositining in bed.   Factors Delaying/Impairing Wound Healing Diabetes Mellitus;Incontinence;Multiple medical problems  Hydrotherapy Plan Debridement;Dressing change;Patient/family education;Pulsatile lavage with suction  Wound Therapy - Frequency 6X / week  Wound Therapy - Current Recommendations Case manager/social work  Wound Therapy - Follow Up Recommendations Skilled nursing facility  Wound Plan Hydrotherapy, dressing changes, enzymatic debridment, and off-loading/repositioning to facilitate wound healing  Wound Therapy Goals - Improve the function of patient's integumentary system by progressing the wound(s) through the phases of wound healing by:  Decrease Necrotic Tissue to 10%  Decrease Necrotic Tissue - Progress Progressing toward goal  Increase Granulation Tissue to 90%  Increase Granulation Tissue - Progress Progressing toward goal  Goals/treatment plan/discharge plan were made with and agreed upon by patient/family Yes  Time For Goal Achievement 2 weeks  Wound Therapy - Potential for Goals Good     Weston Anna, MPT [phone]

## 2017-10-07 NOTE — Plan of Care (Signed)
  Adequate for Discharge Health Behavior/Discharge Planning: Ability to manage health-related needs will improve 10/07/2017 1305 - Adequate for Discharge by Kerrin Mo, RN Clinical Measurements: Ability to maintain clinical measurements within normal limits will improve 10/07/2017 1305 - Adequate for Discharge by Kerrin Mo, RN Will remain free from infection 10/07/2017 1305 - Adequate for Discharge by Kerrin Mo, RN Diagnostic test results will improve 10/07/2017 1305 - Adequate for Discharge by Kerrin Mo, RN Activity: Risk for activity intolerance will decrease 10/07/2017 1305 - Adequate for Discharge by Kerrin Mo, RN Nutrition: Adequate nutrition will be maintained 10/07/2017 1305 - Adequate for Discharge by Kerrin Mo, RN Pain Managment: General experience of comfort will improve 10/07/2017 1305 - Adequate for Discharge by Kerrin Mo, RN Skin Integrity: Risk for impaired skin integrity will decrease 10/07/2017 1305 - Adequate for Discharge by Kerrin Mo, RN

## 2017-10-07 NOTE — Discharge Summary (Signed)
Physician Discharge Summary  Sarah Liu WJX:[id number] DOB: Mar 16, 1946 DOA: 10/02/2017  PCP: Lilian Coma., MD  Admit date: 10/02/2017 Discharge date: 10/07/2017  Admitted From: SNF Disposition:  SNF  Recommendations for Outpatient Follow-up:  1. Follow up with PCP in 1-2 weeks 2. Coreg dose changed as below to 3.125 3. Lasix frequency changed to daily as neede 4. Continue Doxycycline for 10 days 5. Continue Nitrofurantoin for 4 days 6. Please reposition patient frequently to avoid worsening of sacral ulcer 7. Encourage nutrition TID  Home Health: none  Equipment/Devices: none  Discharge Condition: stable CODE STATUS: Full code Diet recommendation: regular   HPI: Per Dr. Glyn Ade, Sarah Liu is a 72 y.o. female with history of diabetes mellitus type 2, peripheral vascular disease status post left AKA, sacral decubitus and heel ulcers, CHF, DVT on Coumadin, CAD, hypothyroidism, history of pancreatic cancer was referred to the ER patient was found to have poor appetite and generally weak.  Patient states she has been having poor appetite and is not liking the food at the skilled nursing facility.  Denies any nausea vomiting or abdominal pain. ED Course: In the ER labs revealed creatinine of 1.7 which has worsened from recent 0.7.  UA shows features concerning for UTI.  Patient looks generally debilitated.  There is a stage IV sacral diabetes also and right heel ulcer.  Patient was started on fluid ceftriaxone and admitted for further observation.  Hospital Course: Acute kidney injury -Likely in the setting of poor p.o. Intake, Lasix, patient received IV fluids and creatinine has now normalized. Changed Lasix from daily to PRN. Hold ARB on discharge, re-evaluate BP and BMP within a week and resume if appropriate History of pancreatic cancer -Unclear as to history regarding  disease, she recently had a CT scan of the abdomen and pelvis just couple months ago at [facility] and  there was without acute findings. Patient tells me that she was diagnosed with pancreatic cancer about 13 years ago, does not know exactly how she was treated but tells me this is all resolved Failure to thrive -poor appetite, continue Megace. Continue nutritional supplements. No abdominal pain, nausea or vomiting. Moving her bowels well.  Type 2 diabetes mellitus -resume home medications Peripheral vascular disease -Status post AKA, she has follow-up with vascular surgery at [facility] in the next couple weeks Coronary artery disease -No chest pain, continue beta-blockers. Hold ARB as above, change Lasix to PRN. Monitor fluid status Urinary tract infection -Urine cultures with MRSA as well as enterococcus, based on sensitivities will narrow to doxycycline & nitrofurantoin.  Patient without significant dysuria / frequency for UTI, no fevers or chills or systemic symptoms, perhaps contaminant but will need to treat further the sacral ulcer anyway Unstageable sacral decubitus ulcer -Wound care consulted, patient needs hydrotherapy, continue at SNF, will cover with doxycycline as above for MRSA. Encourage nutrition, mobilize with PT and frequent repositioning.    Discharge Diagnoses:  Principal Problem:   ARF (acute renal failure) (HCC) Active Problems:   DM (diabetes mellitus), type 2 with renal complications (HCC)   PVD (peripheral vascular disease) (HCC)   CAD (coronary artery disease)   Essential hypertension   Protein-calorie malnutrition, severe   Pressure injury of skin     Discharge Instructions   Allergies as of 10/07/2017      Reactions   Latex Hives   Penicillins Hives      Medication List    STOP taking these medications   ciprofloxacin 500 MG  tablet Commonly known as:  CIPRO   losartan 50 MG tablet Commonly known as:  COZAAR   metroNIDAZOLE 500 MG tablet Commonly known as:  FLAGYL     TAKE these medications   acetaminophen 500 MG tablet Commonly known as:   TYLENOL Take 500 mg by mouth 3 (three) times daily.   acidophilus Caps capsule Take 1 capsule by mouth daily.   aspirin EC 81 MG tablet Take 81 mg by mouth daily.   atorvastatin 40 MG tablet Commonly known as:  LIPITOR Take 40 mg by mouth daily.   Benzocaine 20 % Aero Commonly known as:  HURRCAINE Use as directed 1 application in the mouth or throat.   CALCITRIOL PO Take 0.5 mcg by mouth daily.   carvedilol 6.25 MG tablet Commonly known as:  COREG Take 0.5 tablets (3.125 mg total) by mouth 2 (two) times daily with a meal. What changed:  how much to take   doxycycline 100 MG EC tablet Commonly known as:  DORYX Take 1 tablet (100 mg total) by mouth 2 (two) times daily.   FLUoxetine 20 MG tablet Commonly known as:  PROZAC Take 20 mg by mouth daily.   furosemide 40 MG tablet Commonly known as:  LASIX Take 0.5 tablets (20 mg total) by mouth daily as needed for fluid or edema. What changed:    when to take this  reasons to take this   gabapentin 100 MG capsule Commonly known as:  NEURONTIN Take 100 mg by mouth 3 (three) times daily.   insulin glargine 100 UNIT/ML injection Commonly known as:  LANTUS Inject 0.1 mLs (10 Units total) into the skin at bedtime.   insulin lispro 100 UNIT/ML injection Commonly known as:  HUMALOG Inject 2-6 Units into the skin 3 (three) times daily before meals.   IRON PO Take 1 tablet by mouth daily.   iron polysaccharides 150 MG capsule Commonly known as:  NIFEREX Take 150 mg by mouth daily.   magnesium oxide 400 MG tablet Commonly known as:  MAG-OX Take 400 mg by mouth 2 (two) times daily.   meclizine 25 MG tablet Commonly known as:  ANTIVERT Take 25 mg by mouth daily as needed for dizziness.   MEGA MULTIVITAMIN PO Take 1 tablet by mouth daily.   megestrol 625 MG/5ML suspension Commonly known as:  MEGACE ES Take 5 mLs (625 mg total) by mouth daily.   mirtazapine 15 MG tablet Commonly known as:  REMERON Take 15 mg by  mouth at bedtime.   modafinil 100 MG tablet Commonly known as:  PROVIGIL Take 100 mg by mouth daily.   nitrofurantoin (macrocrystal-monohydrate) 100 MG capsule Commonly known as:  MACROBID Take 1 capsule (100 mg total) by mouth every 12 (twelve) hours.   omeprazole 20 MG capsule Commonly known as:  PRILOSEC Take 20 mg by mouth daily.   oxyCODONE 5 MG immediate release tablet Commonly known as:  Oxy IR/ROXICODONE Take 1 tablet (5 mg total) by mouth every 4 (four) hours as needed.   polyethylene glycol packet Commonly known as:  MIRALAX / GLYCOLAX Take 17 g by mouth daily as needed for mild constipation.   senna 8.6 MG Tabs tablet Commonly known as:  SENOKOT Take 2 tablets by mouth daily as needed for mild constipation.   sodium bicarbonate 650 MG tablet Take 2 tablets (1,300 mg total) by mouth 2 (two) times daily.   traZODone 50 MG tablet Commonly known as:  DESYREL Take 50 mg by mouth at bedtime.  traZODone 100 MG tablet Commonly known as:  DESYREL Take 100 mg by mouth at bedtime as needed for sleep.       Consultations:  Wound care  Procedures/Studies:  Dg Chest 2 View  Result Date: 10/02/2017 CLINICAL DATA:  Failure to thrive.  Former smoker. EXAM: CHEST  2 VIEW COMPARISON:  07/02/2017 FINDINGS: Heart size and mediastinal contours are stable and within normal limits. A curvilinear density overlying the periphery of the left lung likely reflects a skin fold artifact as there are lung markings beyond this. Pneumothorax is not entirely excluded but believed less likely. A repeat chest chest radiograph may help for further correlation. Subsegmental atelectasis and/or scarring is seen in the right mid lung. No acute osseous abnormality. IMPRESSION: Curvilinear density along the periphery of the left lung likely representing a skin fold artifact as lung markings are seen beyond this. Pneumothorax is believed less likely. This could be further correlated with a repeat chest  radiograph with right-side-down decubitus views for confirmation. Minimal subpleural atelectasis and/or scarring in the right mid lung, new since prior. Electronically Signed   By: Ashley Royalty M.D.   On: 10/02/2017 17:21   Ct Head Wo Contrast  Result Date: 09/13/2017 CLINICAL DATA:  Hypoglycemia, altered mental status. EXAM: CT HEAD WITHOUT CONTRAST TECHNIQUE: Contiguous axial images were obtained from the base of the skull through the vertex without intravenous contrast. COMPARISON:  None. FINDINGS: Brain: Generalized age related parenchymal atrophy with commensurate dilatation of the ventricles and sulci. Chronic small vessel ischemic changes within the bilateral periventricular white matter regions and left basal ganglia. No mass, hemorrhage, edema or other evidence of acute parenchymal abnormality. No extra-axial hemorrhage. Vascular: There are chronic calcified atherosclerotic changes of the large vessels at the skull base. No unexpected hyperdense vessel. Skull: Normal. Negative for fracture or focal lesion. Sinuses/Orbits: No acute finding. Other: None. IMPRESSION: 1. No acute findings.  No intracranial mass, hemorrhage or edema. 2. Atrophy and chronic small-vessel ischemic changes, as detailed above. Electronically Signed   By: Franki Cabot M.D.   On: 09/13/2017 21:32   Ct Chest Wo Contrast  Result Date: 10/03/2017 CLINICAL DATA:  Abnormal chest radiograph EXAM: CT CHEST WITHOUT CONTRAST TECHNIQUE: Multidetector CT imaging of the chest was performed following the standard protocol without IV contrast. COMPARISON:  Chest radiographs dated 10/02/2017 FINDINGS: Cardiovascular: The heart is normal in size. No pericardial effusion. No evidence of thoracic aortic aneurysm. Mild atherosclerotic calcifications of the aortic arch. Three vessel coronary atherosclerosis. Mediastinum/Nodes: No suspicious mediastinal lymphadenopathy. Visualized left thyroid is heterogeneous/nodular. Lungs/Pleura: No evidence of  pneumothorax. Radiographic abnormality corresponds to a skin fold. Scattered subpleural nodularity in the right lung, including: --3 mm subpleural nodule in the lateral right upper lobe (series 5/image 43) --Two 5 mm subpleural nodules in the posterior right lower lobe (series 5/image 68), branching, likely infectious --5 mm subpleural nodule in the posterior right lower lobe (series 5/image 88) Additional irregular patchy/branching opacity in the inferolateral right upper lobe (series 5/image 68), favoring mild infection. Left lung is essentially clear. No focal consolidation. No pleural effusion or pneumothorax. Upper Abdomen: Visualized upper abdomen is notable for cholecystectomy clips, vascular calcifications, a 2.2 cm lateral left upper pole renal cyst (series 2/image 119), and splenules in the left upper abdomen. Musculoskeletal: Mild degenerative changes of the visualized thoracolumbar spine. IMPRESSION: No evidence of pneumothorax. Radiographic abnormality corresponds to a skin fold. Mild patchy/nodular opacities in the right lung, most of which favor mild infection/pneumonia. Underlying scattered small pulmonary nodules  measuring up to 5 mm. No follow-up needed if patient is low-risk (and has no known or suspected primary neoplasm). Non-contrast chest CT can be considered in 12 months if patient is high-risk. This recommendation follows the consensus statement: Guidelines for Management of Incidental Pulmonary Nodules Detected on CT Images: From the Fleischner Society 2017; Radiology 2017; 284:228-243. Aortic Atherosclerosis (ICD10-I70.0). Electronically Signed   By: Julian Hy M.D.   On: 10/03/2017 07:14   US Abdomen Limited  Result Date: 10/02/2017 CLINICAL DATA:  Decreased appetite. Elevated alkaline phosphatase. Cholecystectomy. EXAM: ULTRASOUND ABDOMEN LIMITED RIGHT UPPER QUADRANT COMPARISON:  CT of the abdomen and pelvis on 07/03/2017 FINDINGS: Gallbladder: Cholecystectomy. Common bile  duct: Diameter: 2.3 millimeters Liver: Mildly heterogeneous without focal liver lesion. Portal vein is patent on color Doppler imaging with normal direction of blood flow towards the liver. IMPRESSION: 1. Status post cholecystectomy. 2.  No evidence for acute  abnormality. Electronically Signed   By: Nolon Nations M.D.   On: 10/02/2017 20:54   Dg Abd Portable 1v  Result Date: 10/04/2017 CLINICAL DATA:  Constipation and abdominal pain EXAM: PORTABLE ABDOMEN - 1 VIEW COMPARISON:  None. FINDINGS: Scattered large and small bowel gas is noted. No obstructive changes are seen. Diffuse vascular calcifications are seen. Postoperative changes are noted. No acute bony abnormality is seen. IMPRESSION: No acute abnormality noted. Electronically Signed   By: Inez Catalina M.D.   On: 10/04/2017 11:27      Subjective: - no chest pain, shortness of breath, no abdominal pain, nausea or vomiting.   Discharge Exam: Vitals:   10/06/17 2151 10/07/17 0802  BP: (!) 91/53 116/77  Pulse: 86 (!) 101  Resp: 18 16  Temp: 99.3 F (37.4 C)   SpO2: 100% 100%    General: Pt is alert, awake, not in acute distress Cardiovascular: RRR, S1/S2 +, no rubs, no gallops Respiratory: CTA bilaterally, no wheezing, no rhonchi Abdominal: Soft, NT, ND, bowel sounds + Extremities: no edema, no cyanosis    The results of significant diagnostics from this hospitalization (including imaging, microbiology, ancillary and laboratory) are listed below for reference.     Microbiology: Recent Results (from the past 240 hour(s))  Urine Culture     Status: Abnormal   Collection Time: 10/02/17  4:16 PM  Result Value Ref Range Status   Specimen Description   Final    URINE, RANDOM Performed at [facility] [address], [place], [place] [address]    Special Requests   Final    NONE Performed at [facility], [place] [address] [place], [place] [address]    Culture (A)  Final    60,000  COLONIES/mL STAPHYLOCOCCUS AUREUS 60,000 COLONIES/mL ENTEROCOCCUS FAECALIS    Report Status 10/05/2017 FINAL  Final   Organism ID, Bacteria STAPHYLOCOCCUS AUREUS (A)  Final   Organism ID, Bacteria ENTEROCOCCUS FAECALIS (A)  Final      Susceptibility   Enterococcus faecalis - MIC*    AMPICILLIN <=2 SENSITIVE Sensitive     LEVOFLOXACIN >=8 RESISTANT Resistant     NITROFURANTOIN <=16 SENSITIVE Sensitive     VANCOMYCIN 1 SENSITIVE Sensitive     * 60,000 COLONIES/mL ENTEROCOCCUS FAECALIS   Staphylococcus aureus - MIC*    CIPROFLOXACIN >=8 RESISTANT Resistant     GENTAMICIN <=0.5 SENSITIVE Sensitive     NITROFURANTOIN <=16 SENSITIVE Sensitive     OXACILLIN >=4 RESISTANT Resistant     TETRACYCLINE <=1 SENSITIVE Sensitive     VANCOMYCIN <=0.5 SENSITIVE Sensitive  TRIMETH/SULFA 80 RESISTANT Resistant     CLINDAMYCIN >=8 RESISTANT Resistant     RIFAMPIN <=0.5 SENSITIVE Sensitive     Inducible Clindamycin NEGATIVE Sensitive     * 60,000 COLONIES/mL STAPHYLOCOCCUS AUREUS  MRSA PCR Screening     Status: Abnormal   Collection Time: 10/03/17  6:44 PM  Result Value Ref Range Status   MRSA by PCR POSITIVE (A) NEGATIVE Final    Comment:        The GeneXpert MRSA Assay (FDA approved for NASAL specimens only), is one component of a comprehensive MRSA colonization surveillance program. It is not intended to diagnose MRSA infection nor to guide or monitor treatment for MRSA infections. RESULT CALLED TO, READ BACK BY AND VERIFIED WITH: Artis Delay RN [id number] 10/04/17 A NAVARRO Performed at [facility], [place] [address], [place], [place] [address]      Labs: BNP (last 3 results) No results for input(s): BNP in the last 8760 hours. Basic Metabolic Panel: Recent Labs  Lab 10/02/17 1725 10/03/17 0857 10/04/17 0316 10/05/17 0306 10/06/17 0640  NA 135 137 137 136 137  K 4.8 4.8 4.5 4.3 4.8  CL 105 108 110 117* 114*  CO2 21* 19* 22 16* 20*  GLUCOSE 201* 120* 89 153* 165*    BUN 55* 48* 38* 25* 20  CREATININE 1.79* 1.41* 1.10* 0.81 0.87  CALCIUM 9.0 8.4* 8.2* 6.8* 8.0*   Liver Function Tests: Recent Labs  Lab 10/02/17 1725 10/04/17 0316 10/05/17 0306  AST 78* 51* 59*  ALT 31 27 26   ALKPHOS 386* 281* 224*  BILITOT 0.3 0.2* 0.2*  PROT 7.1 5.3* 3.9*  ALBUMIN 2.0* 1.5* 1.1*   No results for input(s): LIPASE, AMYLASE in the last 168 hours. No results for input(s): AMMONIA in the last 168 hours. CBC: Recent Labs  Lab 10/02/17 1725 10/04/17 0316 10/05/17 1120 10/06/17 0640  WBC 8.3 6.5 6.0 5.2  NEUTROABS 6.5  --   --   --   HGB 12.6 8.1* 7.4* 8.0*  HCT 39.3 25.5* 23.2* 24.7*  MCV 92.5 92.7 92.8 93.6  PLT 606* 429* 367 367   Cardiac Enzymes: No results for input(s): CKTOTAL, CKMB, CKMBINDEX, TROPONINI in the last 168 hours. BNP: Invalid input(s): POCBNP CBG: Recent Labs  Lab 10/06/17 1117 10/06/17 1157 10/06/17 1635 10/06/17 2148 10/07/17 0720  GLUCAP 69 96 156* 89 152*   D-Dimer No results for input(s): DDIMER in the last 72 hours. Hgb A1c No results for input(s): HGBA1C in the last 72 hours. Lipid Profile No results for input(s): CHOL, HDL, LDLCALC, TRIG, CHOLHDL, LDLDIRECT in the last 72 hours. Thyroid function studies No results for input(s): TSH, T4TOTAL, T3FREE, THYROIDAB in the last 72 hours.  Invalid input(s): FREET3 Anemia work up No results for input(s): VITAMINB12, FOLATE, FERRITIN, TIBC, IRON, RETICCTPCT in the last 72 hours. Urinalysis    Component Value Date/Time   COLORURINE AMBER (A) 10/02/2017 1649   APPEARANCEUR CLOUDY (A) 10/02/2017 1649   LABSPEC 1.023 10/02/2017 1649   PHURINE 5.0 10/02/2017 1649   GLUCOSEU NEGATIVE 10/02/2017 1649   HGBUR SMALL (A) 10/02/2017 1649   BILIRUBINUR NEGATIVE 10/02/2017 1649   KETONESUR NEGATIVE 10/02/2017 1649   PROTEINUR NEGATIVE 10/02/2017 1649   UROBILINOGEN 0.2 11/06/2013 1426   NITRITE NEGATIVE 10/02/2017 1649   LEUKOCYTESUR LARGE (A) 10/02/2017 1649   Sepsis  Labs Invalid input(s): PROCALCITONIN,  WBC,  LACTICIDVEN   Time coordinating discharge: 35 minutes  SIGNED:  Marzetta Board, MD  [facility] 10/07/2017,  10:12 AM Pager [phone]  If 7PM-7AM, please contact night-coverage [email or web address] Password [email or web address]

## 2017-10-07 NOTE — Clinical Social Work Placement (Signed)
Patient returning to [facility]. Facility aware of patient's discharge and confirmed patient's ability to return. PTAR contacted, patient's friend notified. Patient's RN can call report to [phone] Room 1004B, packet complete. CSW signing off, no other needs identified at this time.   CLINICAL SOCIAL WORK PLACEMENT  NOTE  Date:  10/07/2017  Patient Details  Name: Sarah Liu MRN: [id number] Date of Birth: Dec 08, 1945  Clinical Social Work is seeking post-discharge placement for this patient at the [place] level of care (*CSW will initial, date and re-position this form in  chart as items are completed):  Yes   Patient/family provided with [facility] Work Department's list of facilities offering this level of care within the geographic area requested by the patient (or if unable, by the patient's family).  Yes   Patient/family informed of their freedom to choose among providers that offer the needed level of care, that participate in Medicare, Medicaid or managed care program needed by the patient, have an available bed and are willing to accept the patient.  Yes   Patient/family informed of [facility]'s ownership interest in [facility] and [facility], as well as of the fact that they are under no obligation to receive care at these facilities.  PASRR submitted to EDS on       PASRR number received on       Existing PASRR number confirmed on 10/06/17     FL2 transmitted to all facilities in geographic area requested by pt/family on 10/06/17     FL2 transmitted to all facilities within larger geographic area on       Patient informed that his/her managed care company has contracts with or will negotiate with certain facilities, including the following:        Yes   Patient/family informed of bed offers received.  Patient chooses bed at [facility]     Physician recommends and patient chooses bed at      Patient to be  transferred to [facility] on 10/07/17.  Patient to be transferred to facility by PTAR     Patient family notified on 10/07/17 of transfer.  Name of family member notified:  Letitia Caul     PHYSICIAN       Additional Comment:    _______________________________________________ Burnis Medin, LCSW 10/07/2017, 1:20 PM

## 2017-10-07 NOTE — Progress Notes (Signed)
Report called to Quillian Quince at [facility]. All questions answered. Pt aware of return to [facility] and agreeable.

## 2017-10-07 NOTE — Progress Notes (Signed)
Patient refuses vital sign check this morning after multiple attempt.

## 2017-11-28 DEATH — deceased

## 2019-01-12 IMAGING — CT CT CHEST W/O CM
2 of 4 series · 14 of 36 positions shown, 17 images · non-contrast
Comparison: Chest radiographs dated 10/02/2017

CLINICAL DATA: Abnormal chest radiograph

EXAM:
CT CHEST WITHOUT CONTRAST
TECHNIQUE: Multidetector CT imaging of the chest was performed following the
standard protocol without IV contrast.

[Series 2: thorax · axial · 0.64mm/px · z∈[+1348,+1588]mm · 11 of 140 slices shown, 14 images]
[im 10/140  mediastinal]
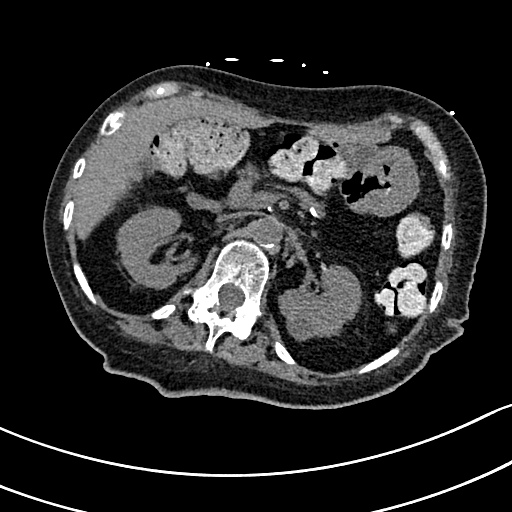
[im 10/140  lung]
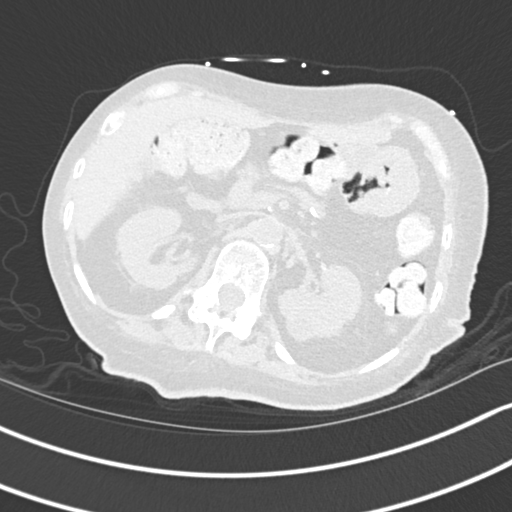
[im 20/140  lung]
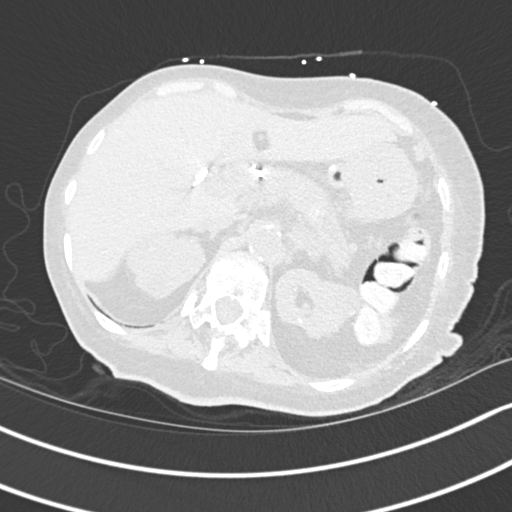
[im 30/140  lung]
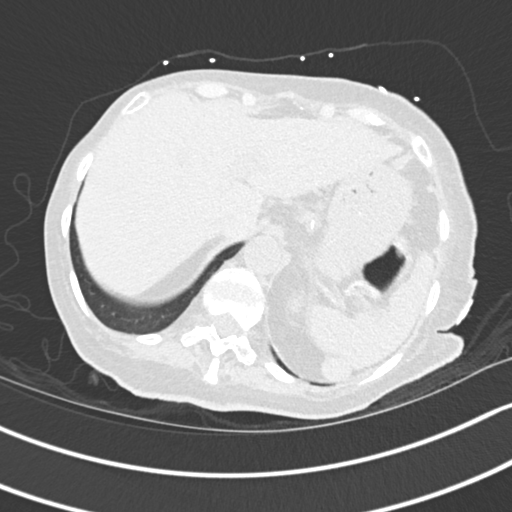
[im 50/140  lung]
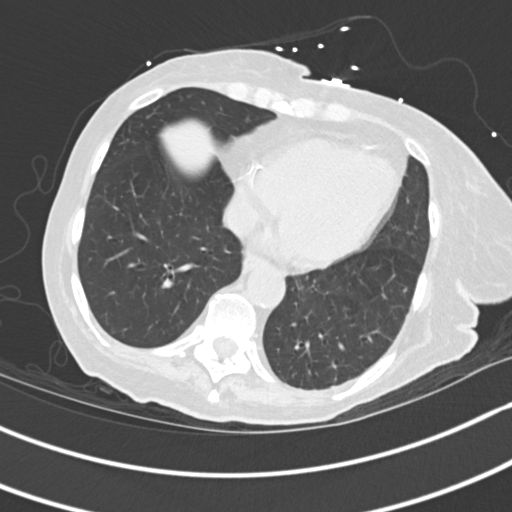
[im 60/140  mediastinal]
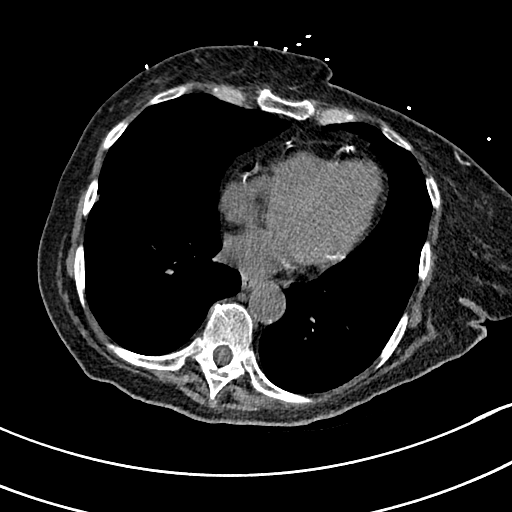
[im 60/140  lung]
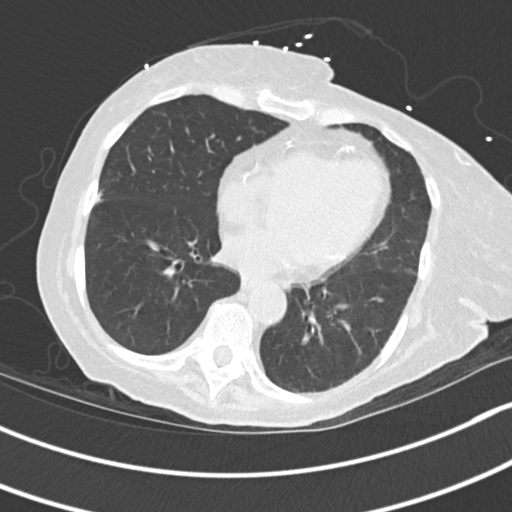
[im 70/140  lung]
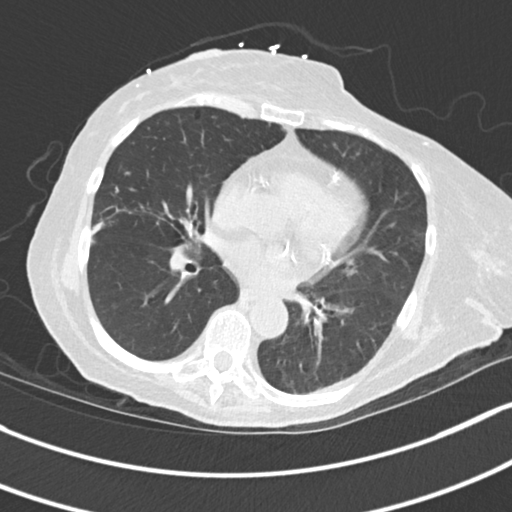
[im 80/140  lung]
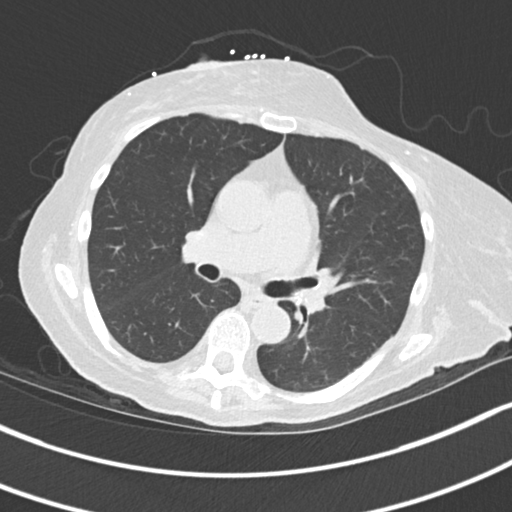
[im 90/140  lung]
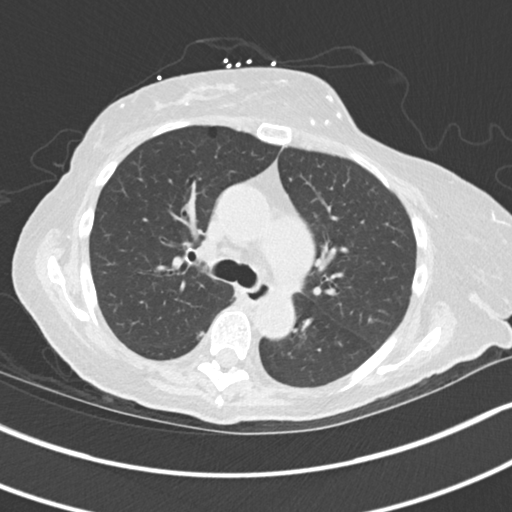
[im 110/140  mediastinal]
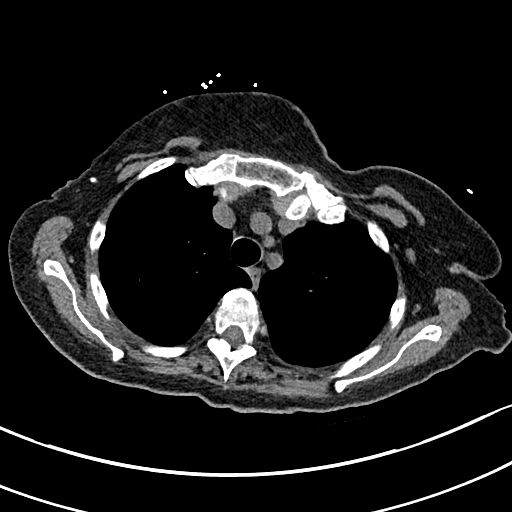
[im 110/140  lung]
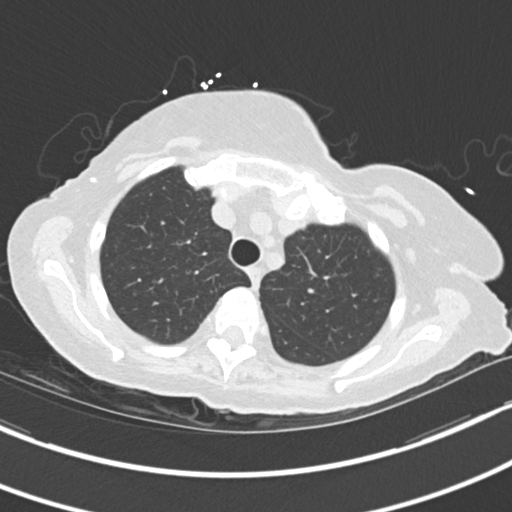
[im 120/140  lung]
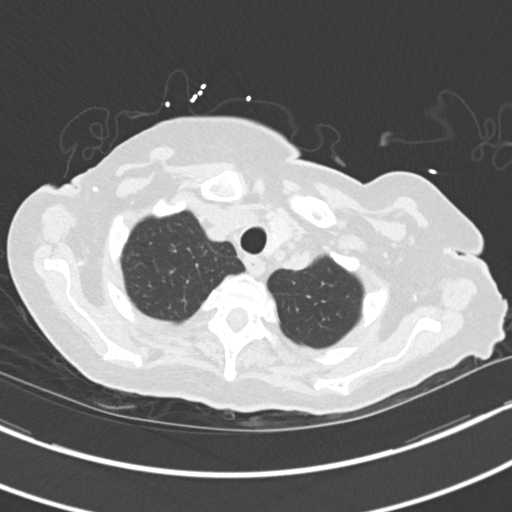
[im 130/140  lung]
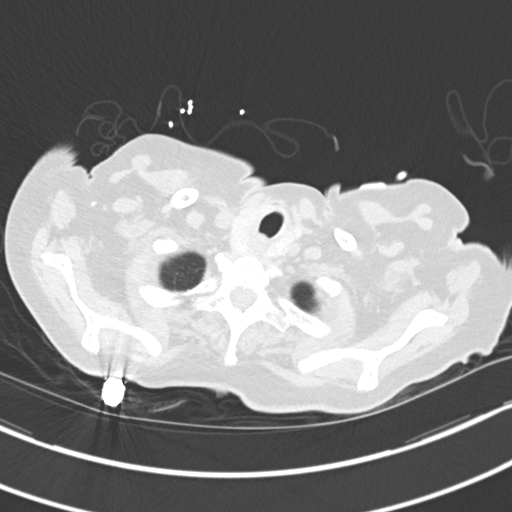

[Series 6: coronal · coronal · 0.57mm/px · 3 of 151 slices shown]
[im 31/151  lung]
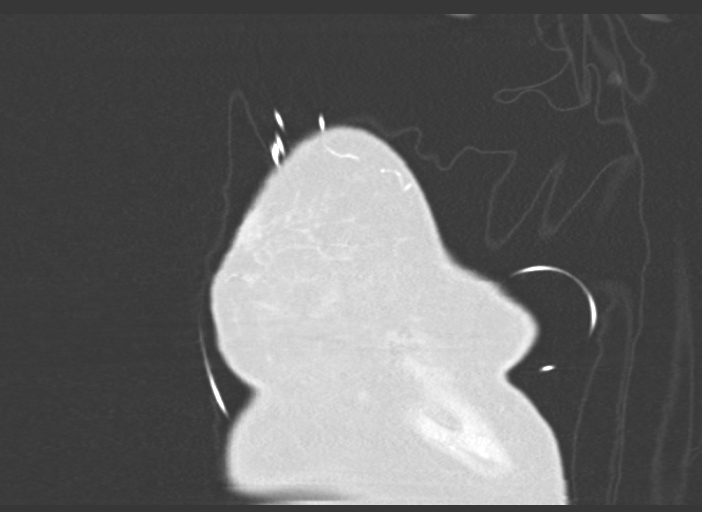
[im 61/151  lung]
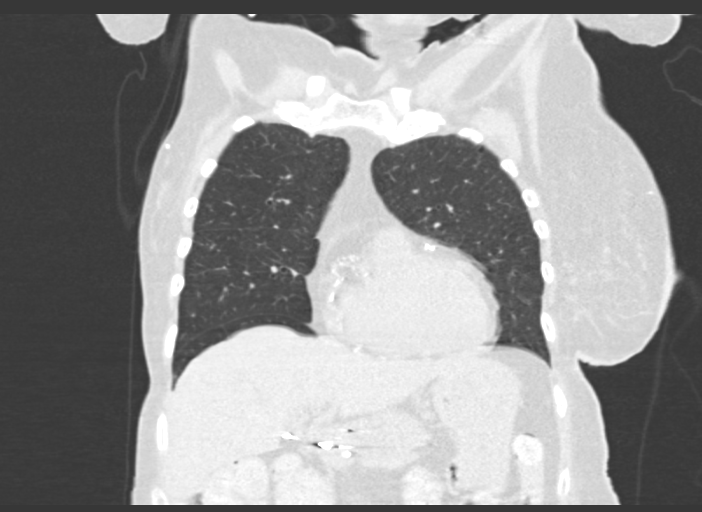
[im 91/151  lung]
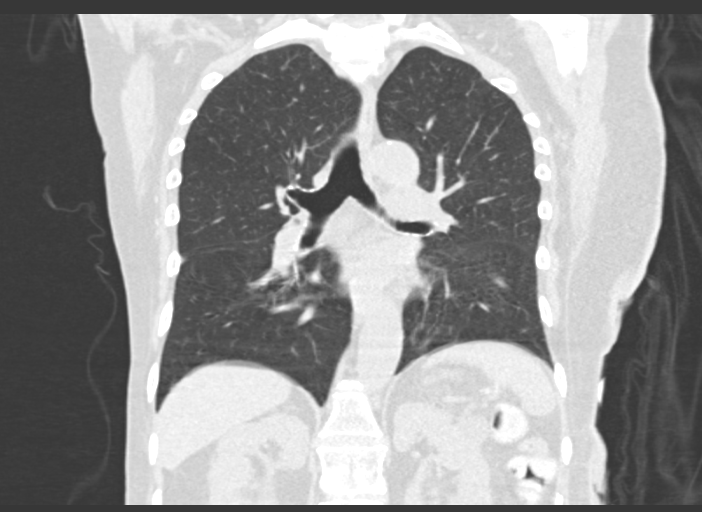

[14 of 36 positions shown; findings below may reference images not displayed]

FINDINGS: Cardiovascular: The heart is normal in size. No pericardial
effusion.

No evidence of thoracic aortic aneurysm. Mild atherosclerotic
calcifications of the aortic arch.

Three vessel coronary atherosclerosis.

Mediastinum/Nodes: No suspicious mediastinal lymphadenopathy.

Visualized left thyroid is heterogeneous/nodular.

Lungs/Pleura: No evidence of pneumothorax. Radiographic abnormality
corresponds to a skin fold.

Scattered subpleural nodularity in the right lung, including:

--3 mm subpleural nodule in the lateral right upper lobe (series
5/image 43)

--Two 5 mm subpleural nodules in the posterior right lower lobe
(series 5/image 68), branching, likely infectious

--5 mm subpleural nodule in the posterior right lower lobe (series
5/image 88)

Additional irregular patchy/branching opacity in the inferolateral
right upper lobe (series 5/image 68), favoring mild infection.

Left lung is essentially clear.

No focal consolidation.

No pleural effusion or pneumothorax.

Upper Abdomen: Visualized upper abdomen is notable for
cholecystectomy clips, vascular calcifications, a 2.2 cm lateral
left upper pole renal cyst (series 2/image 119), and splenules in
the left upper abdomen.

Musculoskeletal: Mild degenerative changes of the visualized
thoracolumbar spine.
IMPRESSION: No evidence of pneumothorax. Radiographic abnormality corresponds to
a skin fold.

Mild patchy/nodular opacities in the right lung, most of which favor
mild infection/pneumonia.

Underlying scattered small pulmonary nodules measuring up to 5 mm.
No follow-up needed if patient is low-risk (and has no known or
suspected primary neoplasm). Non-contrast chest CT can be considered
in 12 months if patient is high-risk. This recommendation follows
the consensus statement: Guidelines for Management of Incidental
Pulmonary Nodules Detected on CT Images: From the [HOSPITAL]

Aortic Atherosclerosis (NK108-IY0.0).
